# Patient Record
Sex: Female | Born: 1935 | Race: White | Hispanic: No
Health system: Southern US, Community
[De-identification: ages and names within clinical notes are randomized; demographics above are authoritative.]

---

## 2004-05-11 ENCOUNTER — Ambulatory Visit: Payer: Self-pay | Admitting: Oncology

## 2004-05-18 ENCOUNTER — Encounter: Payer: Self-pay | Admitting: Oncology

## 2004-06-06 ENCOUNTER — Encounter: Payer: Self-pay | Admitting: Oncology

## 2004-06-12 ENCOUNTER — Ambulatory Visit: Payer: Self-pay | Admitting: Oncology

## 2004-07-10 ENCOUNTER — Ambulatory Visit: Payer: Self-pay | Admitting: Oncology

## 2004-07-17 ENCOUNTER — Encounter: Payer: Self-pay | Admitting: Oncology

## 2004-08-06 ENCOUNTER — Encounter: Payer: Self-pay | Admitting: Oncology

## 2004-08-06 ENCOUNTER — Ambulatory Visit: Payer: Self-pay | Admitting: Oncology

## 2004-09-06 ENCOUNTER — Ambulatory Visit: Payer: Self-pay | Admitting: Oncology

## 2004-11-13 ENCOUNTER — Ambulatory Visit: Payer: Self-pay | Admitting: Oncology

## 2004-11-20 ENCOUNTER — Ambulatory Visit: Payer: Self-pay | Admitting: Oncology

## 2004-11-21 ENCOUNTER — Emergency Department: Payer: Self-pay | Admitting: Emergency Medicine

## 2004-12-04 ENCOUNTER — Ambulatory Visit: Payer: Self-pay | Admitting: Oncology

## 2004-12-20 ENCOUNTER — Emergency Department: Payer: Self-pay | Admitting: Unknown Physician Specialty

## 2005-01-04 ENCOUNTER — Ambulatory Visit: Payer: Self-pay | Admitting: Oncology

## 2005-02-19 ENCOUNTER — Ambulatory Visit: Payer: Self-pay | Admitting: Family Medicine

## 2005-02-20 ENCOUNTER — Ambulatory Visit: Payer: Self-pay | Admitting: Family Medicine

## 2005-02-26 ENCOUNTER — Ambulatory Visit: Payer: Self-pay | Admitting: Oncology

## 2005-03-09 ENCOUNTER — Ambulatory Visit: Payer: Self-pay | Admitting: Oncology

## 2005-03-15 ENCOUNTER — Ambulatory Visit: Payer: Self-pay | Admitting: Oncology

## 2005-04-06 ENCOUNTER — Ambulatory Visit: Payer: Self-pay | Admitting: Oncology

## 2005-05-06 ENCOUNTER — Ambulatory Visit: Payer: Self-pay | Admitting: Oncology

## 2005-06-06 ENCOUNTER — Ambulatory Visit: Payer: Self-pay | Admitting: Oncology

## 2005-07-06 ENCOUNTER — Ambulatory Visit: Payer: Self-pay | Admitting: Oncology

## 2005-09-05 ENCOUNTER — Ambulatory Visit: Payer: Self-pay | Admitting: Oncology

## 2005-11-03 ENCOUNTER — Other Ambulatory Visit: Payer: Self-pay

## 2005-11-03 ENCOUNTER — Emergency Department: Payer: Self-pay | Admitting: Unknown Physician Specialty

## 2005-12-04 ENCOUNTER — Ambulatory Visit: Payer: Self-pay | Admitting: Oncology

## 2006-01-04 ENCOUNTER — Ambulatory Visit: Payer: Self-pay | Admitting: Oncology

## 2006-02-03 ENCOUNTER — Ambulatory Visit: Payer: Self-pay | Admitting: Oncology

## 2006-03-06 ENCOUNTER — Ambulatory Visit: Payer: Self-pay | Admitting: Oncology

## 2006-06-03 ENCOUNTER — Ambulatory Visit: Payer: Self-pay | Admitting: Oncology

## 2006-06-06 ENCOUNTER — Ambulatory Visit: Payer: Self-pay | Admitting: Oncology

## 2006-09-02 ENCOUNTER — Ambulatory Visit: Payer: Self-pay | Admitting: Oncology

## 2006-09-10 ENCOUNTER — Ambulatory Visit: Payer: Self-pay | Admitting: Oncology

## 2006-10-04 ENCOUNTER — Emergency Department: Payer: Self-pay | Admitting: Emergency Medicine

## 2006-10-04 ENCOUNTER — Other Ambulatory Visit: Payer: Self-pay

## 2006-10-06 ENCOUNTER — Ambulatory Visit: Payer: Self-pay | Admitting: Oncology

## 2007-02-04 ENCOUNTER — Ambulatory Visit: Payer: Self-pay | Admitting: Internal Medicine

## 2007-02-28 ENCOUNTER — Ambulatory Visit: Payer: Self-pay | Admitting: Internal Medicine

## 2007-03-07 ENCOUNTER — Ambulatory Visit: Payer: Self-pay | Admitting: Internal Medicine

## 2007-07-12 ENCOUNTER — Other Ambulatory Visit: Payer: Self-pay

## 2007-07-12 ENCOUNTER — Inpatient Hospital Stay: Payer: Self-pay | Admitting: Internal Medicine

## 2007-09-07 ENCOUNTER — Ambulatory Visit: Payer: Self-pay | Admitting: Internal Medicine

## 2007-09-22 ENCOUNTER — Ambulatory Visit: Payer: Self-pay | Admitting: Internal Medicine

## 2007-10-05 ENCOUNTER — Ambulatory Visit: Payer: Self-pay | Admitting: Internal Medicine

## 2007-12-25 ENCOUNTER — Emergency Department: Payer: Self-pay | Admitting: Emergency Medicine

## 2007-12-25 ENCOUNTER — Other Ambulatory Visit: Payer: Self-pay

## 2008-04-04 ENCOUNTER — Emergency Department: Payer: Self-pay | Admitting: Emergency Medicine

## 2008-09-01 ENCOUNTER — Emergency Department: Payer: Self-pay | Admitting: Emergency Medicine

## 2009-01-01 ENCOUNTER — Emergency Department: Payer: Self-pay | Admitting: Unknown Physician Specialty

## 2009-02-16 ENCOUNTER — Ambulatory Visit: Payer: Self-pay | Admitting: Orthopedic Surgery

## 2009-03-02 ENCOUNTER — Ambulatory Visit: Payer: Self-pay | Admitting: Orthopedic Surgery

## 2009-03-15 ENCOUNTER — Ambulatory Visit: Payer: Self-pay | Admitting: Pain Medicine

## 2009-05-24 ENCOUNTER — Ambulatory Visit: Payer: Self-pay | Admitting: Pain Medicine

## 2009-06-15 ENCOUNTER — Ambulatory Visit: Payer: Self-pay | Admitting: Physician Assistant

## 2009-07-27 ENCOUNTER — Emergency Department: Payer: Self-pay

## 2010-08-04 ENCOUNTER — Ambulatory Visit: Payer: Self-pay | Admitting: Ophthalmology

## 2010-08-14 ENCOUNTER — Ambulatory Visit: Payer: Self-pay | Admitting: Ophthalmology

## 2011-04-26 ENCOUNTER — Inpatient Hospital Stay: Payer: Self-pay | Admitting: Internal Medicine

## 2011-05-01 ENCOUNTER — Emergency Department: Payer: Self-pay | Admitting: Emergency Medicine

## 2011-05-05 ENCOUNTER — Inpatient Hospital Stay: Payer: Self-pay | Admitting: Specialist

## 2011-05-28 ENCOUNTER — Emergency Department: Payer: Self-pay | Admitting: Emergency Medicine

## 2011-05-29 ENCOUNTER — Emergency Department: Payer: Self-pay | Admitting: *Deleted

## 2011-06-07 ENCOUNTER — Ambulatory Visit: Payer: Self-pay | Admitting: Internal Medicine

## 2011-06-17 ENCOUNTER — Emergency Department: Payer: Self-pay | Admitting: Internal Medicine

## 2011-06-19 ENCOUNTER — Encounter: Payer: Self-pay | Admitting: Cardiothoracic Surgery

## 2011-06-19 ENCOUNTER — Encounter: Payer: Self-pay | Admitting: Nurse Practitioner

## 2011-06-27 ENCOUNTER — Inpatient Hospital Stay: Payer: Self-pay | Admitting: Internal Medicine

## 2011-07-07 ENCOUNTER — Ambulatory Visit: Payer: Self-pay | Admitting: Internal Medicine

## 2011-09-25 ENCOUNTER — Inpatient Hospital Stay: Payer: Self-pay | Admitting: Internal Medicine

## 2011-09-25 LAB — PRO B NATRIURETIC PEPTIDE: B-Type Natriuretic Peptide: 29710 pg/mL — ABNORMAL HIGH (ref 0–450)

## 2011-09-25 LAB — COMPREHENSIVE METABOLIC PANEL
Calcium, Total: 8.9 mg/dL (ref 8.5–10.1)
Chloride: 96 mmol/L — ABNORMAL LOW (ref 98–107)
Co2: 27 mmol/L (ref 21–32)
EGFR (African American): >60
EGFR (Non-African Amer.): >60
Glucose: 111 mg/dL — ABNORMAL HIGH (ref 65–99)
Potassium: 4.1 mmol/L (ref 3.5–5.1)
SGOT(AST): 41 U/L — ABNORMAL HIGH (ref 15–37)
Sodium: 132 mmol/L — ABNORMAL LOW (ref 136–145)

## 2011-09-25 LAB — URINALYSIS, COMPLETE
Glucose,UR: NEGATIVE mg/dL (ref 0–75)
Hyaline Cast: 1
Ketone: NEGATIVE
Nitrite: NEGATIVE
Specific Gravity: 1.008 (ref 1.003–1.030)
Squamous Epithelial: <1
WBC UR: 4 /HPF (ref 0–5)

## 2011-09-25 LAB — CBC
HCT: 40.8 % (ref 35.0–47.0)
HGB: 13.5 g/dL (ref 12.0–16.0)
MCHC: 33.2 g/dL (ref 32.0–36.0)
RBC: 4.36 10*6/uL (ref 3.80–5.20)
WBC: 11.6 10*3/uL — ABNORMAL HIGH (ref 3.6–11.0)

## 2011-09-25 LAB — TROPONIN I: Troponin-I: 0.03 ng/mL

## 2011-09-25 LAB — CK TOTAL AND CKMB (NOT AT ARMC): CK, Total: 78 U/L (ref 21–215)

## 2011-09-26 LAB — CBC WITH DIFFERENTIAL/PLATELET
Basophil #: 0 10*3/uL (ref 0.0–0.1)
Basophil %: 0.2 %
Eosinophil %: 0.1 %
HCT: 32.9 % — ABNORMAL LOW (ref 35.0–47.0)
HGB: 10.8 g/dL — ABNORMAL LOW (ref 12.0–16.0)
Lymphocyte #: 1.6 10*3/uL (ref 1.0–3.6)
Lymphocyte %: 10.2 %
Monocyte %: 10.4 %
Neutrophil #: 12.5 10*3/uL — ABNORMAL HIGH (ref 1.4–6.5)
Platelet: 188 10*3/uL (ref 150–440)
RBC: 3.54 10*6/uL — ABNORMAL LOW (ref 3.80–5.20)
RDW: 13.9 % (ref 11.5–14.5)
WBC: 15.8 10*3/uL — ABNORMAL HIGH (ref 3.6–11.0)

## 2011-09-26 LAB — BASIC METABOLIC PANEL
Anion Gap: 10 (ref 7–16)
BUN: 22 mg/dL — ABNORMAL HIGH (ref 7–18)
Chloride: 96 mmol/L — ABNORMAL LOW (ref 98–107)
Co2: 29 mmol/L (ref 21–32)
Creatinine: 1.14 mg/dL (ref 0.60–1.30)
EGFR (African American): 58 — ABNORMAL LOW
EGFR (Non-African Amer.): 48 — ABNORMAL LOW
Sodium: 135 mmol/L — ABNORMAL LOW (ref 136–145)

## 2011-09-26 LAB — LIPID PANEL
Cholesterol: 135 mg/dL (ref 0–200)
Ldl Cholesterol, Calc: 82 mg/dL (ref 0–100)
Triglycerides: 69 mg/dL (ref 0–200)

## 2011-09-27 LAB — BASIC METABOLIC PANEL
BUN: 29 mg/dL — ABNORMAL HIGH (ref 7–18)
Calcium, Total: 9 mg/dL (ref 8.5–10.1)
Co2: 30 mmol/L (ref 21–32)
EGFR (Non-African Amer.): 51 — ABNORMAL LOW
Glucose: 82 mg/dL (ref 65–99)
Potassium: 3.9 mmol/L (ref 3.5–5.1)
Sodium: 136 mmol/L (ref 136–145)

## 2011-09-27 LAB — CBC WITH DIFFERENTIAL/PLATELET
Basophil %: 0.5 %
Eosinophil #: 0.2 10*3/uL (ref 0.0–0.7)
Eosinophil %: 2.1 %
Lymphocyte #: 2.4 10*3/uL (ref 1.0–3.6)
Lymphocyte %: 29.6 %
MCH: 30.4 pg (ref 26.0–34.0)
MCV: 93 fL (ref 80–100)
Monocyte #: 0.8 10*3/uL — ABNORMAL HIGH (ref 0.0–0.7)
Monocyte %: 10.1 %
Neutrophil #: 4.6 10*3/uL (ref 1.4–6.5)
Neutrophil %: 57.7 %
Platelet: 220 10*3/uL (ref 150–440)
RBC: 3.59 10*6/uL — ABNORMAL LOW (ref 3.80–5.20)

## 2011-09-28 LAB — BASIC METABOLIC PANEL
Anion Gap: 13 (ref 7–16)
BUN: 31 mg/dL — ABNORMAL HIGH (ref 7–18)
Calcium, Total: 8.4 mg/dL — ABNORMAL LOW (ref 8.5–10.1)
Chloride: 99 mmol/L (ref 98–107)
Co2: 26 mmol/L (ref 21–32)
Creatinine: 0.71 mg/dL (ref 0.60–1.30)
EGFR (African American): >60
Osmolality: 281 (ref 275–301)
Sodium: 138 mmol/L (ref 136–145)

## 2011-09-28 LAB — CBC WITH DIFFERENTIAL/PLATELET
Basophil %: 0.3 %
Eosinophil %: 3.3 %
Lymphocyte #: 1.3 10*3/uL (ref 1.0–3.6)
MCV: 94 fL (ref 80–100)
Monocyte %: 14.3 %
Platelet: 193 10*3/uL (ref 150–440)
RBC: 3.42 10*6/uL — ABNORMAL LOW (ref 3.80–5.20)
WBC: 5.6 10*3/uL (ref 3.6–11.0)

## 2011-09-30 ENCOUNTER — Inpatient Hospital Stay: Payer: Self-pay | Admitting: Internal Medicine

## 2011-09-30 LAB — COMPREHENSIVE METABOLIC PANEL
Albumin: 2.9 g/dL — ABNORMAL LOW (ref 3.4–5.0)
Alkaline Phosphatase: 94 U/L (ref 50–136)
BUN: 44 mg/dL — ABNORMAL HIGH (ref 7–18)
Chloride: 101 mmol/L (ref 98–107)
Co2: 28 mmol/L (ref 21–32)
Creatinine: 1.78 mg/dL — ABNORMAL HIGH (ref 0.60–1.30)
EGFR (African American): 35 — ABNORMAL LOW
EGFR (Non-African Amer.): 29 — ABNORMAL LOW
Glucose: 119 mg/dL — ABNORMAL HIGH (ref 65–99)
Osmolality: 290 (ref 275–301)
Potassium: 4.8 mmol/L (ref 3.5–5.1)
Sodium: 139 mmol/L (ref 136–145)

## 2011-09-30 LAB — CBC
HCT: 33.2 % — ABNORMAL LOW (ref 35.0–47.0)
HGB: 11 g/dL — ABNORMAL LOW (ref 12.0–16.0)
MCH: 30.7 pg (ref 26.0–34.0)
Platelet: 209 10*3/uL (ref 150–440)
RBC: 3.59 10*6/uL — ABNORMAL LOW (ref 3.80–5.20)
WBC: 9.4 10*3/uL (ref 3.6–11.0)

## 2011-09-30 LAB — PHOSPHORUS: Phosphorus: 4.7 mg/dL (ref 2.5–4.9)

## 2011-09-30 LAB — CULTURE, BLOOD (SINGLE)

## 2011-09-30 LAB — PRO B NATRIURETIC PEPTIDE: B-Type Natriuretic Peptide: 18474 pg/mL — ABNORMAL HIGH (ref 0–450)

## 2011-09-30 LAB — CK TOTAL AND CKMB (NOT AT ARMC)
CK, Total: 22 U/L (ref 21–215)
CK-MB: 0.9 ng/mL (ref 0.5–3.6)

## 2011-10-01 LAB — URINALYSIS, COMPLETE
Leukocyte Esterase: NEGATIVE
Nitrite: NEGATIVE
Ph: 5 (ref 4.5–8.0)
Protein: NEGATIVE
RBC,UR: 244 /HPF (ref 0–5)
WBC UR: 4 /HPF (ref 0–5)

## 2011-10-01 LAB — COMPREHENSIVE METABOLIC PANEL
Albumin: 2.7 g/dL — ABNORMAL LOW (ref 3.4–5.0)
Alkaline Phosphatase: 80 U/L (ref 50–136)
Anion Gap: 9 (ref 7–16)
Calcium, Total: 8.9 mg/dL (ref 8.5–10.1)
Chloride: 104 mmol/L (ref 98–107)
Co2: 30 mmol/L (ref 21–32)
EGFR (Non-African Amer.): 30 — ABNORMAL LOW
Glucose: 103 mg/dL — ABNORMAL HIGH (ref 65–99)
Potassium: 4.7 mmol/L (ref 3.5–5.1)
SGOT(AST): 61 U/L — ABNORMAL HIGH (ref 15–37)
SGPT (ALT): 59 U/L
Sodium: 143 mmol/L (ref 136–145)

## 2011-10-01 LAB — CBC WITH DIFFERENTIAL/PLATELET
Basophil #: 0 10*3/uL (ref 0.0–0.1)
Basophil %: 0.3 %
Eosinophil %: 0.3 %
HCT: 30.3 % — ABNORMAL LOW (ref 35.0–47.0)
HGB: 10 g/dL — ABNORMAL LOW (ref 12.0–16.0)
Lymphocyte %: 16.6 %
MCHC: 33.1 g/dL (ref 32.0–36.0)
MCV: 92 fL (ref 80–100)
Monocyte #: 0.8 10*3/uL — ABNORMAL HIGH (ref 0.0–0.7)
Neutrophil %: 73.2 %
Platelet: 194 10*3/uL (ref 150–440)
RBC: 3.3 10*6/uL — ABNORMAL LOW (ref 3.80–5.20)
WBC: 8.4 10*3/uL (ref 3.6–11.0)

## 2011-10-01 LAB — CK TOTAL AND CKMB (NOT AT ARMC)
CK, Total: 26 U/L (ref 21–215)
CK-MB: 1.4 ng/mL (ref 0.5–3.6)
CK-MB: 1 ng/mL (ref 0.5–3.6)

## 2011-10-01 LAB — TROPONIN I
Troponin-I: 0.07 ng/mL — ABNORMAL HIGH
Troponin-I: 0.06 ng/mL — ABNORMAL HIGH

## 2011-10-02 LAB — CBC WITH DIFFERENTIAL/PLATELET
Basophil #: 0 10*3/uL (ref 0.0–0.1)
Basophil %: 0.2 %
Eosinophil %: 2.3 %
Lymphocyte %: 20.4 %
MCHC: 33 g/dL (ref 32.0–36.0)
MCV: 92 fL (ref 80–100)
Monocyte #: 0.8 10*3/uL — ABNORMAL HIGH (ref 0.0–0.7)
Monocyte %: 12.7 %
Platelet: 198 10*3/uL (ref 150–440)
RDW: 13.9 % (ref 11.5–14.5)
WBC: 6.6 10*3/uL (ref 3.6–11.0)

## 2011-10-02 LAB — APTT: Activated PTT: 32.3 secs (ref 23.6–35.9)

## 2011-10-02 LAB — BASIC METABOLIC PANEL
Anion Gap: 11 (ref 7–16)
Calcium, Total: 8.6 mg/dL (ref 8.5–10.1)
EGFR (African American): 47 — ABNORMAL LOW
EGFR (Non-African Amer.): 39 — ABNORMAL LOW
Osmolality: 293 (ref 275–301)
Sodium: 144 mmol/L (ref 136–145)

## 2011-10-02 LAB — PROTIME-INR: INR: 1.1

## 2011-10-30 ENCOUNTER — Ambulatory Visit: Payer: Self-pay | Admitting: Internal Medicine

## 2011-10-30 LAB — CBC WITH DIFFERENTIAL/PLATELET
Basophil #: 0.1 10*3/uL (ref 0.0–0.1)
Basophil %: 1 %
Eosinophil #: 0.2 10*3/uL (ref 0.0–0.7)
Eosinophil %: 2.8 %
HCT: 32.9 % — ABNORMAL LOW (ref 35.0–47.0)
HGB: 11 g/dL — ABNORMAL LOW (ref 12.0–16.0)
Lymphocyte #: 1.9 10*3/uL (ref 1.0–3.6)
Lymphocyte %: 30.2 %
MCH: 30.4 pg (ref 26.0–34.0)
MCHC: 33.4 g/dL (ref 32.0–36.0)
MCV: 91 fL (ref 80–100)
Monocyte #: 1 10*3/uL — ABNORMAL HIGH (ref 0.0–0.7)
Monocyte %: 15.7 %
Neutrophil #: 3.2 10*3/uL (ref 1.4–6.5)
Neutrophil %: 50.3 %
Platelet: 278 10*3/uL (ref 150–440)
RBC: 3.62 10*6/uL — ABNORMAL LOW (ref 3.80–5.20)
RDW: 13.9 % (ref 11.5–14.5)
WBC: 6.5 10*3/uL (ref 3.6–11.0)

## 2011-10-30 LAB — URINALYSIS, COMPLETE
Bacteria: NONE SEEN
Bilirubin,UR: NEGATIVE
Blood: NEGATIVE
Glucose,UR: NEGATIVE mg/dL (ref 0–75)
Hyaline Cast: 4
Ketone: NEGATIVE
Leukocyte Esterase: NEGATIVE
Nitrite: NEGATIVE
Ph: 5 (ref 4.5–8.0)
Protein: NEGATIVE
RBC,UR: NONE SEEN /HPF (ref 0–5)
Specific Gravity: 1.017 (ref 1.003–1.030)
Squamous Epithelial: 8
WBC UR: 5 /HPF (ref 0–5)

## 2011-10-30 LAB — BASIC METABOLIC PANEL
Anion Gap: 7 (ref 7–16)
BUN: 22 mg/dL — ABNORMAL HIGH (ref 7–18)
Calcium, Total: 9 mg/dL (ref 8.5–10.1)
Chloride: 101 mmol/L (ref 98–107)
Co2: 33 mmol/L — ABNORMAL HIGH (ref 21–32)
Creatinine: 1.12 mg/dL (ref 0.60–1.30)
EGFR (African American): 59 — ABNORMAL LOW
EGFR (Non-African Amer.): 49 — ABNORMAL LOW
Glucose: 94 mg/dL (ref 65–99)
Osmolality: 284 (ref 275–301)
Potassium: 3.4 mmol/L — ABNORMAL LOW (ref 3.5–5.1)
Sodium: 141 mmol/L (ref 136–145)

## 2011-10-30 LAB — TSH: Thyroid Stimulating Horm: 1.76 u[IU]/mL

## 2011-11-21 ENCOUNTER — Inpatient Hospital Stay: Payer: Self-pay | Admitting: Specialist

## 2011-11-21 LAB — CBC
HCT: 32.7 % — ABNORMAL LOW (ref 35.0–47.0)
MCH: 29.5 pg (ref 26.0–34.0)
Platelet: 177 10*3/uL (ref 150–440)
RBC: 3.56 10*6/uL — ABNORMAL LOW (ref 3.80–5.20)
RDW: 14.7 % — ABNORMAL HIGH (ref 11.5–14.5)

## 2011-11-21 LAB — COMPREHENSIVE METABOLIC PANEL
Albumin: 3.5 g/dL (ref 3.4–5.0)
Alkaline Phosphatase: 74 U/L (ref 50–136)
Anion Gap: 6 — ABNORMAL LOW (ref 7–16)
BUN: 17 mg/dL (ref 7–18)
Bilirubin,Total: 0.6 mg/dL (ref 0.2–1.0)
Calcium, Total: 8.9 mg/dL (ref 8.5–10.1)
Chloride: 96 mmol/L — ABNORMAL LOW (ref 98–107)
Co2: 36 mmol/L — ABNORMAL HIGH (ref 21–32)
EGFR (African American): >60
EGFR (Non-African Amer.): >60
Glucose: 79 mg/dL (ref 65–99)
Osmolality: 276 (ref 275–301)

## 2011-11-21 LAB — CK TOTAL AND CKMB (NOT AT ARMC)
CK, Total: 44 U/L (ref 21–215)
CK-MB: 1.8 ng/mL (ref 0.5–3.6)

## 2011-11-21 LAB — TROPONIN I: Troponin-I: 0.04 ng/mL

## 2011-11-22 LAB — CBC WITH DIFFERENTIAL/PLATELET
Basophil %: 0.2 %
Eosinophil #: 0.1 10*3/uL (ref 0.0–0.7)
Eosinophil %: 2.4 %
HGB: 10.6 g/dL — ABNORMAL LOW (ref 12.0–16.0)
Lymphocyte #: 1.2 10*3/uL (ref 1.0–3.6)
Lymphocyte %: 21.4 %
MCH: 29.8 pg (ref 26.0–34.0)
MCHC: 32.5 g/dL (ref 32.0–36.0)
MCV: 92 fL (ref 80–100)
Neutrophil %: 60.4 %
Platelet: 184 10*3/uL (ref 150–440)
RBC: 3.54 10*6/uL — ABNORMAL LOW (ref 3.80–5.20)
RDW: 14.4 % (ref 11.5–14.5)

## 2011-11-22 LAB — BASIC METABOLIC PANEL
Calcium, Total: 9 mg/dL (ref 8.5–10.1)
Co2: 35 mmol/L — ABNORMAL HIGH (ref 21–32)
Creatinine: 0.81 mg/dL (ref 0.60–1.30)
EGFR (African American): >60
EGFR (Non-African Amer.): >60
Glucose: 110 mg/dL — ABNORMAL HIGH (ref 65–99)
Osmolality: 274 (ref 275–301)
Potassium: 5.1 mmol/L (ref 3.5–5.1)

## 2011-11-23 LAB — BASIC METABOLIC PANEL
Anion Gap: 3 — ABNORMAL LOW (ref 7–16)
Calcium, Total: 9.2 mg/dL (ref 8.5–10.1)
EGFR (African American): >60
Glucose: 86 mg/dL (ref 65–99)
Potassium: 4.1 mmol/L (ref 3.5–5.1)

## 2012-01-04 ENCOUNTER — Emergency Department: Payer: Self-pay | Admitting: Emergency Medicine

## 2012-01-04 LAB — COMPREHENSIVE METABOLIC PANEL
Albumin: 3.2 g/dL — ABNORMAL LOW (ref 3.4–5.0)
Bilirubin,Total: 0.3 mg/dL (ref 0.2–1.0)
Calcium, Total: 8.9 mg/dL (ref 8.5–10.1)
Chloride: 101 mmol/L (ref 98–107)
Co2: 32 mmol/L (ref 21–32)
Creatinine: 0.87 mg/dL (ref 0.60–1.30)
EGFR (African American): >60
EGFR (Non-African Amer.): 59 — ABNORMAL LOW
Osmolality: 281 (ref 275–301)
SGOT(AST): 29 U/L (ref 15–37)
SGPT (ALT): 23 U/L
Sodium: 140 mmol/L (ref 136–145)

## 2012-01-04 LAB — CBC
HCT: 33.8 % — ABNORMAL LOW (ref 35.0–47.0)
HGB: 11 g/dL — ABNORMAL LOW (ref 12.0–16.0)
MCH: 30 pg (ref 26.0–34.0)
MCHC: 32.6 g/dL (ref 32.0–36.0)
MCV: 92 fL (ref 80–100)
RDW: 14.8 % — ABNORMAL HIGH (ref 11.5–14.5)
WBC: 5.1 10*3/uL (ref 3.6–11.0)

## 2012-01-04 LAB — PROTIME-INR
INR: 1.1
Prothrombin Time: 14.5 secs (ref 11.5–14.7)

## 2012-01-04 LAB — TROPONIN I: Troponin-I: 0.02 ng/mL

## 2012-01-04 LAB — CK TOTAL AND CKMB (NOT AT ARMC)
CK, Total: 40 U/L (ref 21–215)
CK-MB: 2.3 ng/mL (ref 0.5–3.6)

## 2012-02-16 ENCOUNTER — Inpatient Hospital Stay: Payer: Self-pay | Admitting: Specialist

## 2012-02-16 LAB — LIPASE, BLOOD: Lipase: 98 U/L (ref 73–393)

## 2012-02-16 LAB — URINALYSIS, COMPLETE
Bacteria: NONE SEEN
Bilirubin,UR: NEGATIVE
Glucose,UR: NEGATIVE mg/dL (ref 0–75)
Hyaline Cast: 4
Ketone: NEGATIVE
Ph: 5 (ref 4.5–8.0)
Protein: 30
Specific Gravity: 1.017 (ref 1.003–1.030)
Squamous Epithelial: 1

## 2012-02-16 LAB — COMPREHENSIVE METABOLIC PANEL
Alkaline Phosphatase: 92 U/L (ref 50–136)
Anion Gap: 1 — ABNORMAL LOW (ref 7–16)
BUN: 20 mg/dL — ABNORMAL HIGH (ref 7–18)
Chloride: 102 mmol/L (ref 98–107)
Creatinine: 0.95 mg/dL (ref 0.60–1.30)
EGFR (African American): >60
Osmolality: 281 (ref 275–301)
Potassium: 4.1 mmol/L (ref 3.5–5.1)
Sodium: 140 mmol/L (ref 136–145)

## 2012-02-16 LAB — CBC WITH DIFFERENTIAL/PLATELET
Eosinophil %: 2.4 %
HGB: 11.5 g/dL — ABNORMAL LOW (ref 12.0–16.0)
Lymphocyte #: 1.3 10*3/uL (ref 1.0–3.6)
Lymphocyte %: 31 %
MCHC: 32.1 g/dL (ref 32.0–36.0)
MCV: 91 fL (ref 80–100)
Monocyte #: 0.7 x10 3/mm (ref 0.2–0.9)
Monocyte %: 15.4 %
Neutrophil %: 51.1 %
Platelet: 177 10*3/uL (ref 150–440)

## 2012-02-16 LAB — TROPONIN I
Troponin-I: 0.03 ng/mL
Troponin-I: 0.03 ng/mL

## 2012-02-16 LAB — CK TOTAL AND CKMB (NOT AT ARMC): CK, Total: 25 U/L (ref 21–215)

## 2012-02-16 LAB — TSH: Thyroid Stimulating Horm: 2.42 u[IU]/mL

## 2012-02-16 LAB — CK-MB: CK-MB: 0.8 ng/mL (ref 0.5–3.6)

## 2012-02-16 LAB — PRO B NATRIURETIC PEPTIDE: B-Type Natriuretic Peptide: 17271 pg/mL — ABNORMAL HIGH (ref 0–450)

## 2012-02-17 LAB — CBC WITH DIFFERENTIAL/PLATELET
Basophil #: 0 10*3/uL (ref 0.0–0.1)
Eosinophil #: 0.1 10*3/uL (ref 0.0–0.7)
Eosinophil %: 2.5 %
Lymphocyte #: 1.4 10*3/uL (ref 1.0–3.6)
Lymphocyte %: 32 %
MCV: 92 fL (ref 80–100)
Monocyte %: 17.4 %
Neutrophil %: 47.9 %
Platelet: 116 10*3/uL — ABNORMAL LOW (ref 150–440)
RBC: 3.81 10*6/uL (ref 3.80–5.20)
RDW: 14.5 % (ref 11.5–14.5)
WBC: 4.4 10*3/uL (ref 3.6–11.0)

## 2012-02-17 LAB — CK-MB: CK-MB: 1.1 ng/mL (ref 0.5–3.6)

## 2012-02-17 LAB — TROPONIN I: Troponin-I: 0.02 ng/mL

## 2012-02-18 LAB — URINE CULTURE

## 2012-02-27 ENCOUNTER — Ambulatory Visit: Payer: Self-pay | Admitting: Internal Medicine

## 2012-04-06 DEATH — deceased

## 2013-07-08 IMAGING — US US EXTREM LOW VENOUS BILAT
1 series · 17 of 24 positions shown · non-contrast
Comparison: none

REASON FOR EXAM: elevated d dimer
COMMENTS:

[Series 1: us extrem low venous bilat · 17 of 48 slices shown]
[im 1/48]
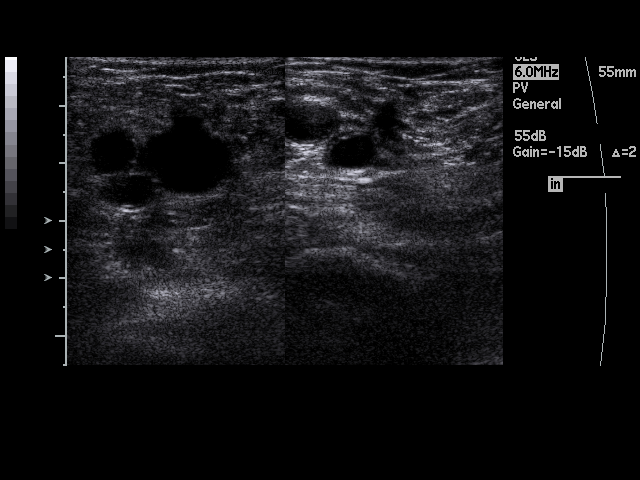
[im 5/48]
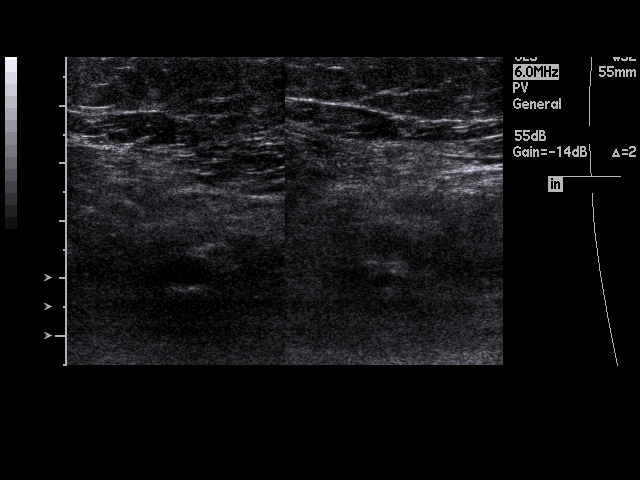
[im 7/48]
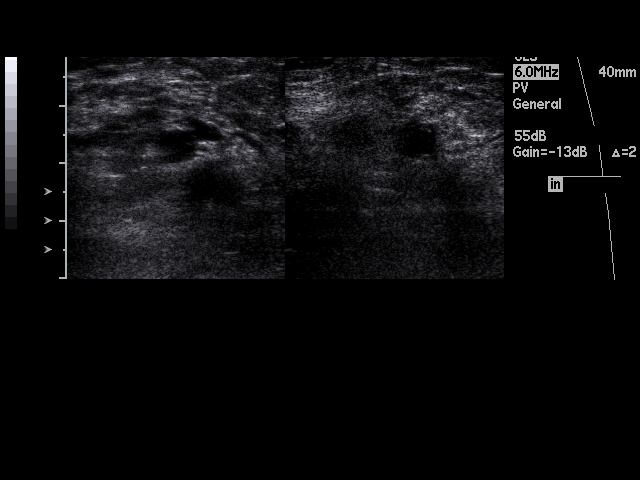
[im 9/48]
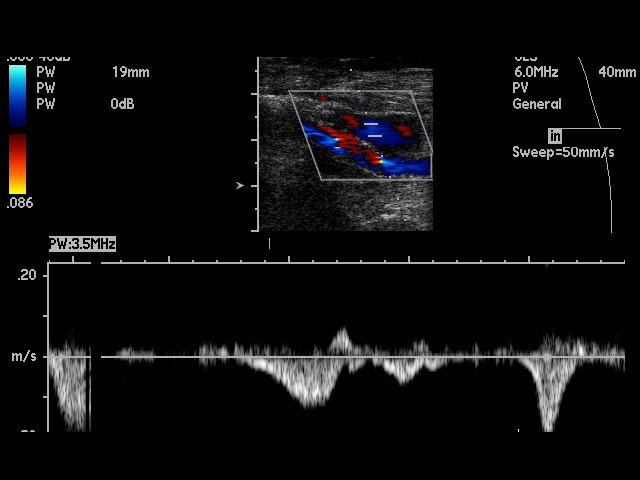
[im 13/48]
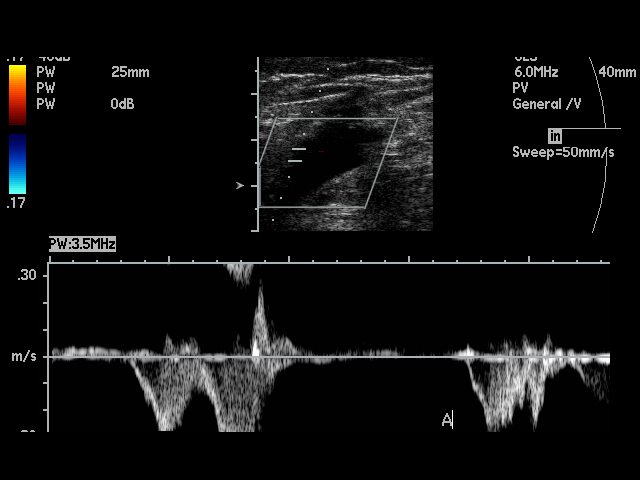
[im 15/48]
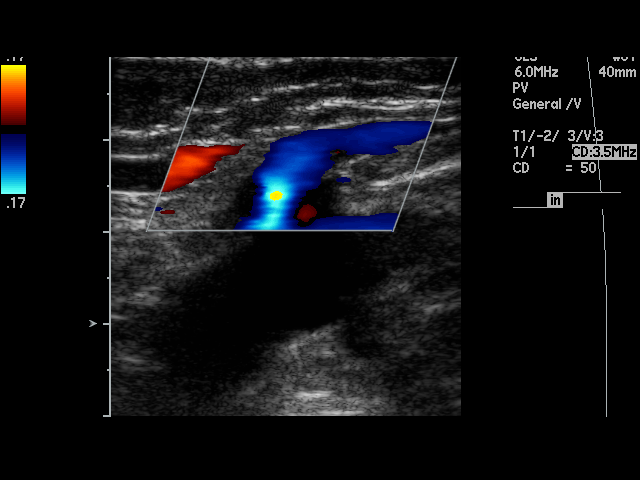
[im 19/48]
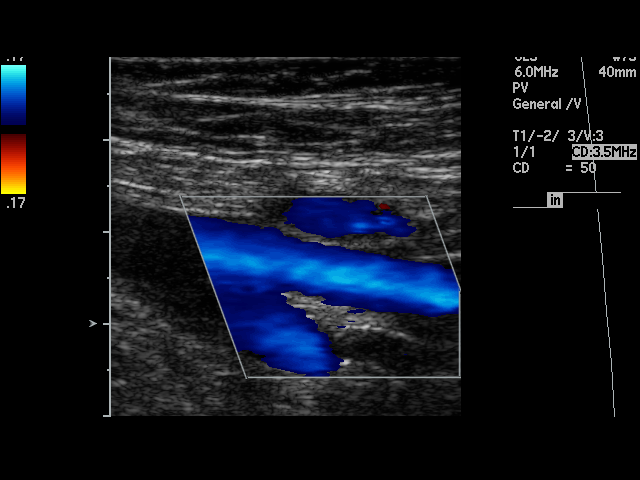
[im 21/48]
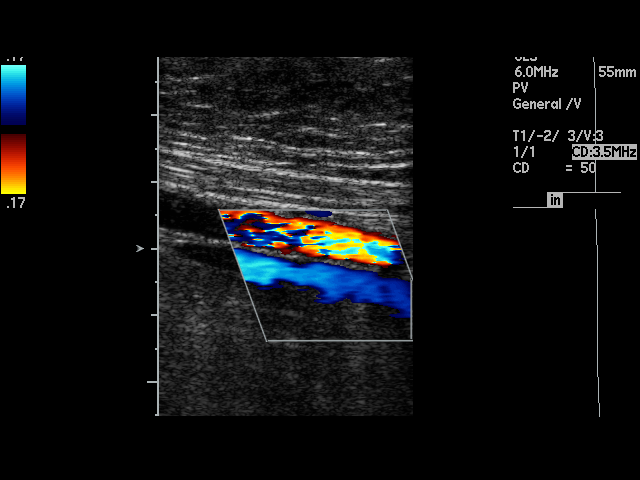
[im 25/48]
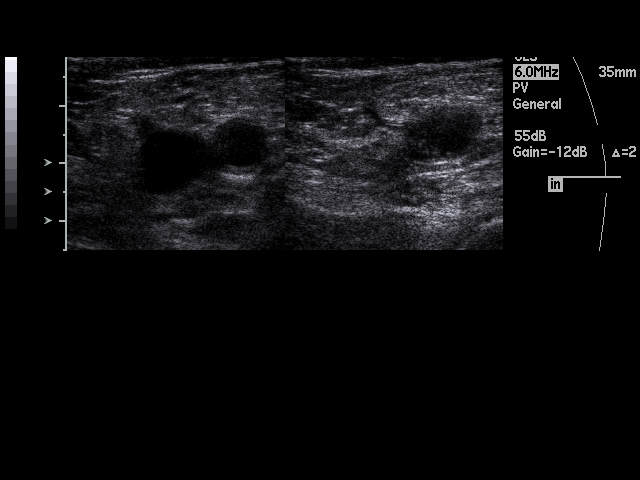
[im 27/48]
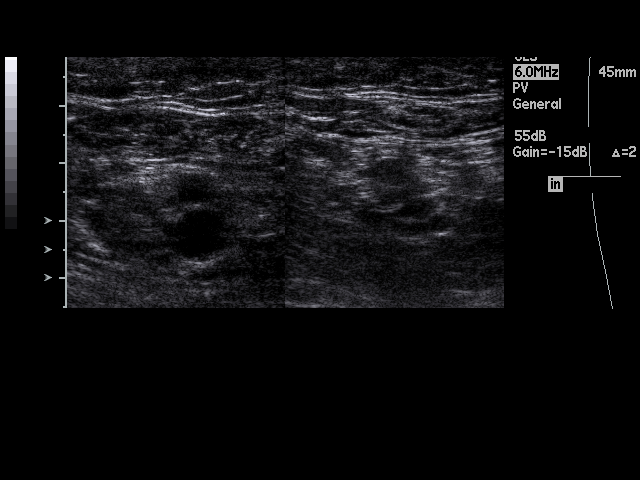
[im 29/48]
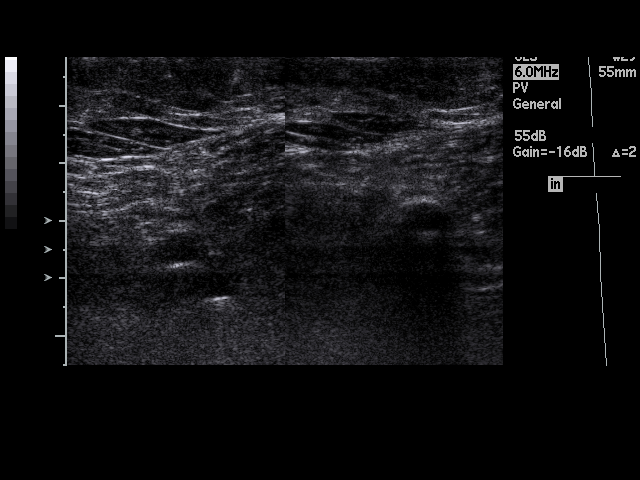
[im 33/48]
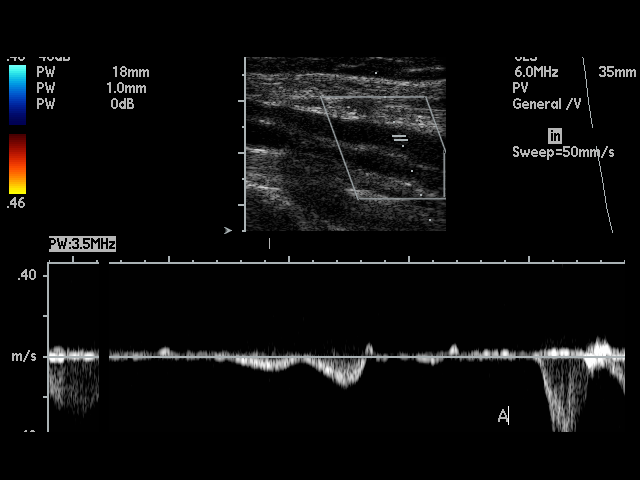
[im 35/48]
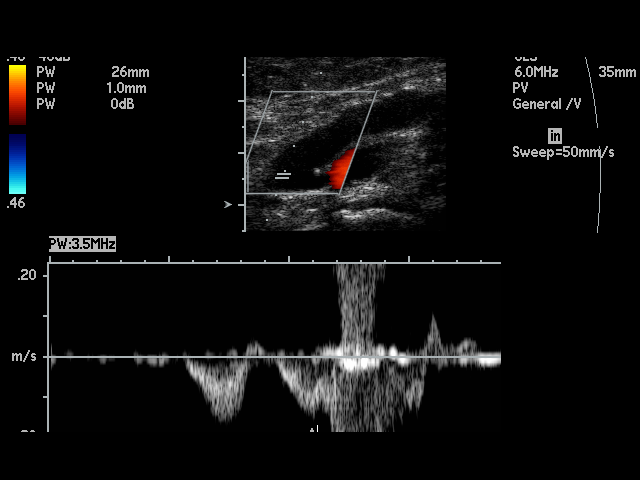
[im 39/48]
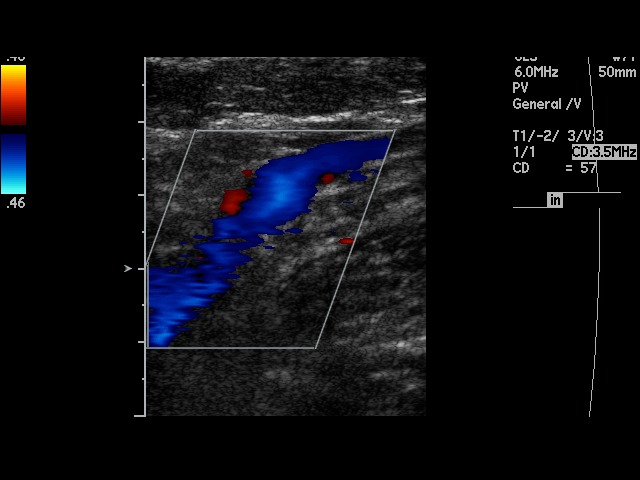
[im 41/48]
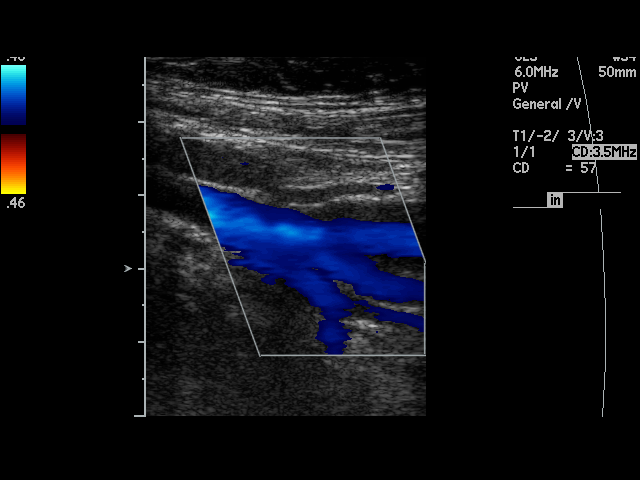
[im 43/48]
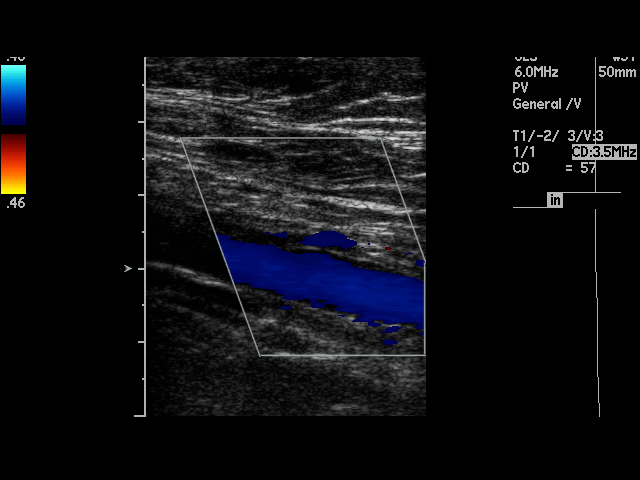
[im 48/48]
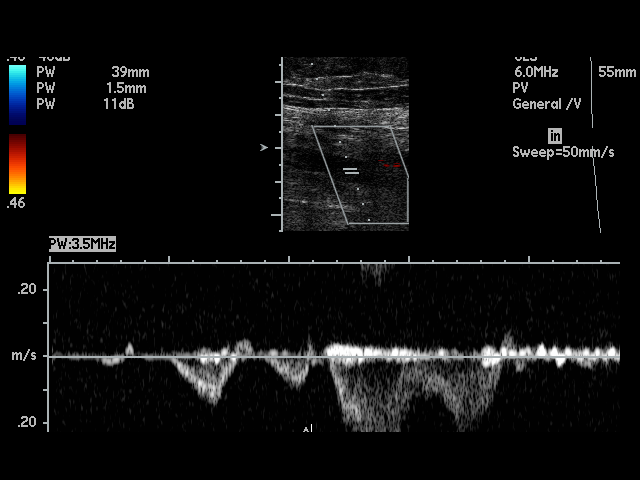

[17 of 24 positions shown; findings below may reference images not displayed]

PROCEDURE:     US  - US DOPPLER LOW EXTR BILATERAL  - April 26, 2011  [DATE]

RESULT:     The right and left femoral and popliteal veins are normally
compressible. The waveform patterns are normal and the color flow images are
normal. The response to the augmentation and Valsalva maneuvers is normal.
IMPRESSION: I see no evidence of thrombus within the right or left
femoral or popliteal veins.

## 2013-07-09 IMAGING — US US EXTREM UP VENOUS*L*
1 series · 17 of 24 positions shown · non-contrast
Comparison: none

REASON FOR EXAM: Left UE swelling
COMMENTS:

[Series 1: us extrem up venous*left* · 17 of 36 slices shown]
[im 1/36]
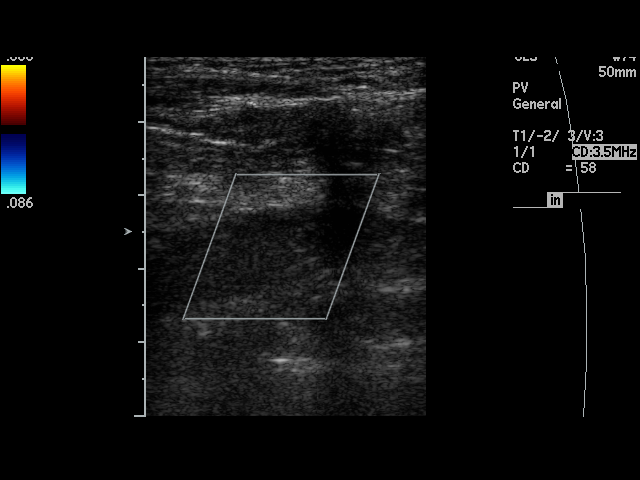
[im 4/36]
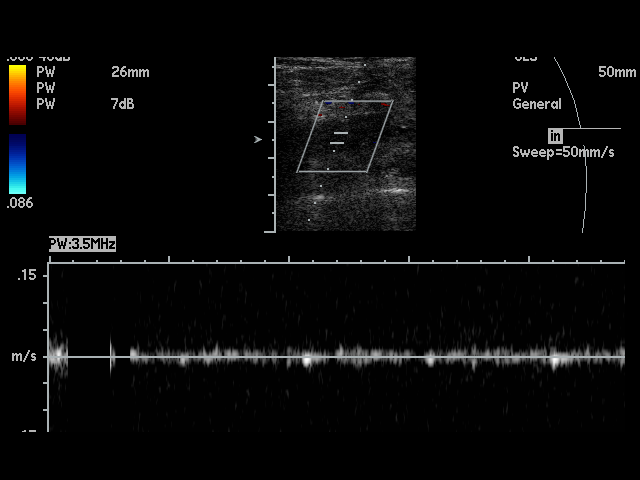
[im 5/36]
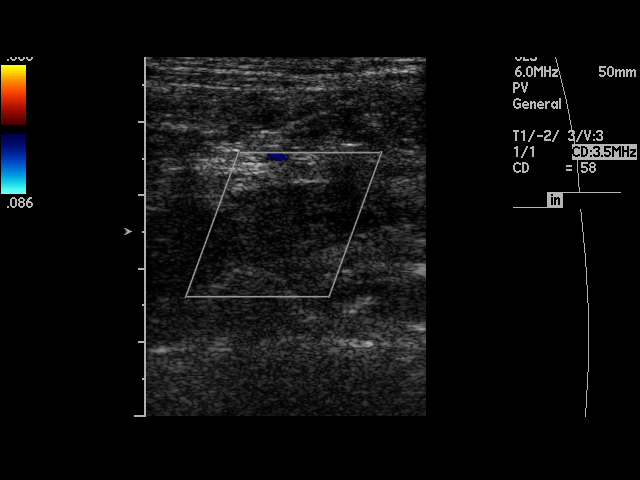
[im 7/36]
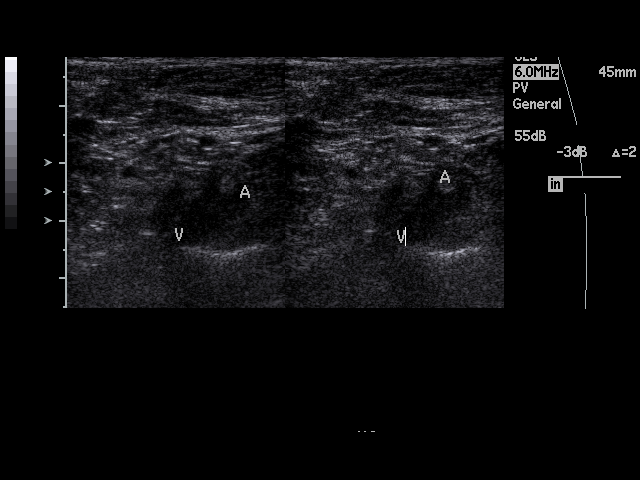
[im 10/36]
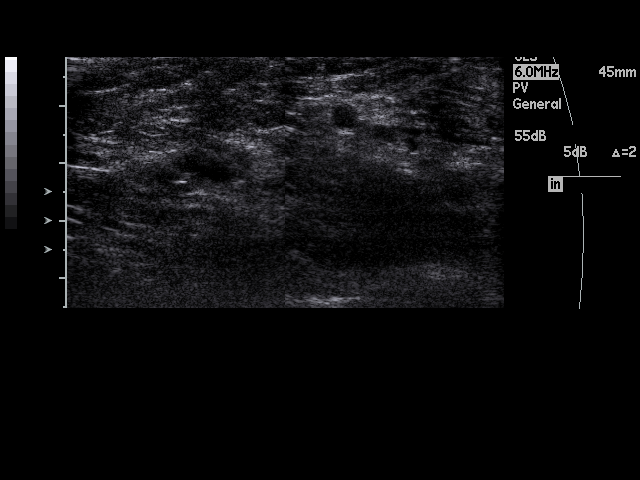
[im 11/36]
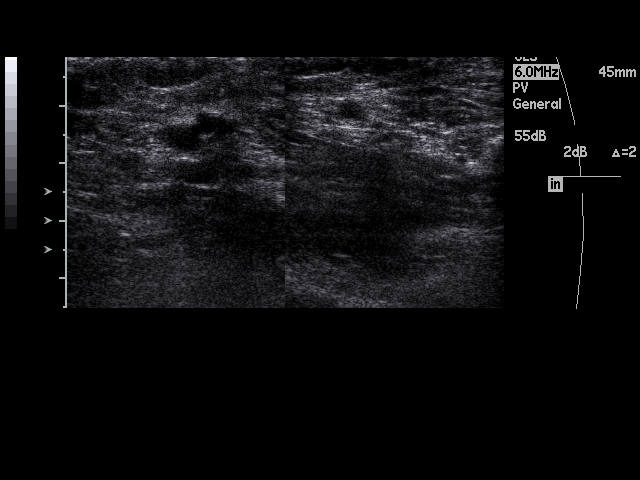
[im 14/36]
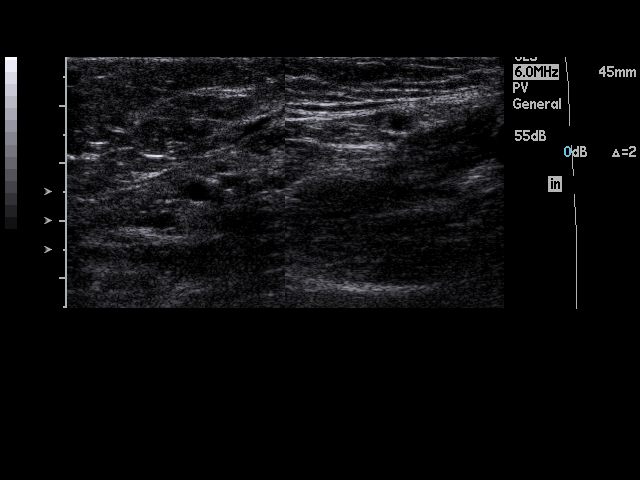
[im 16/36]
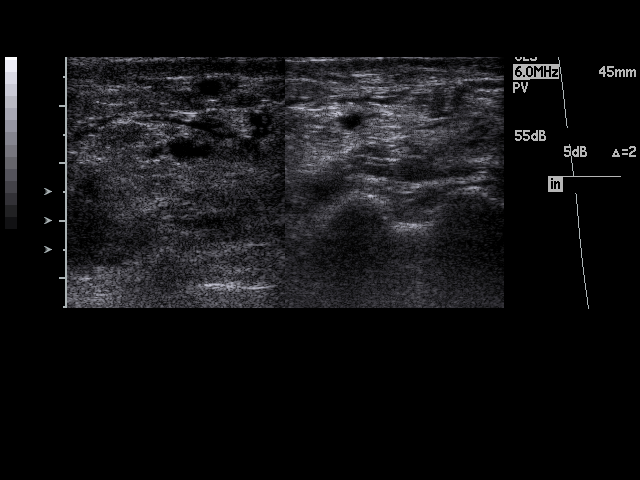
[im 19/36]
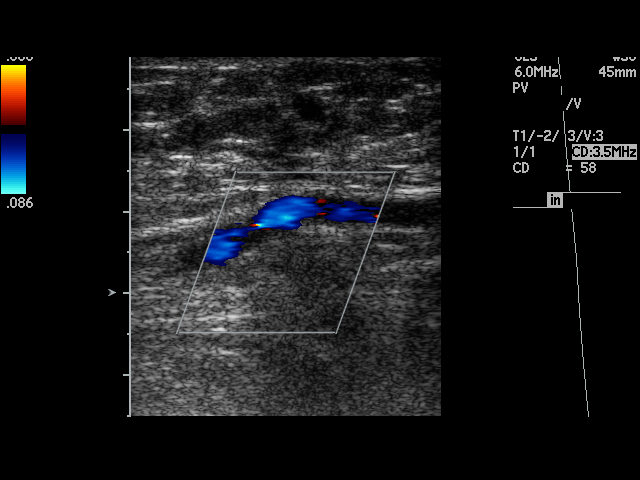
[im 20/36]
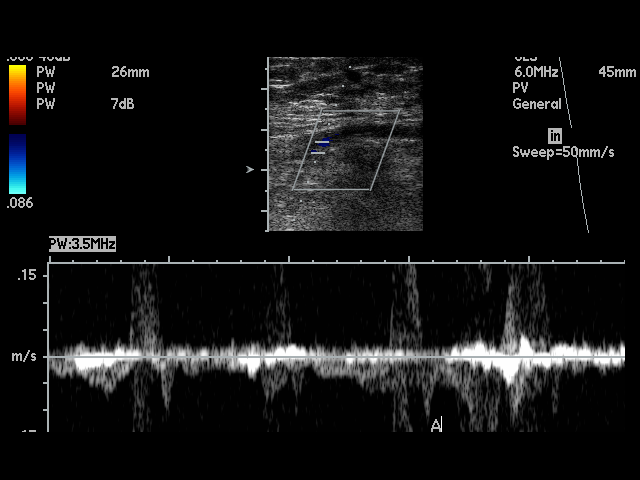
[im 22/36]
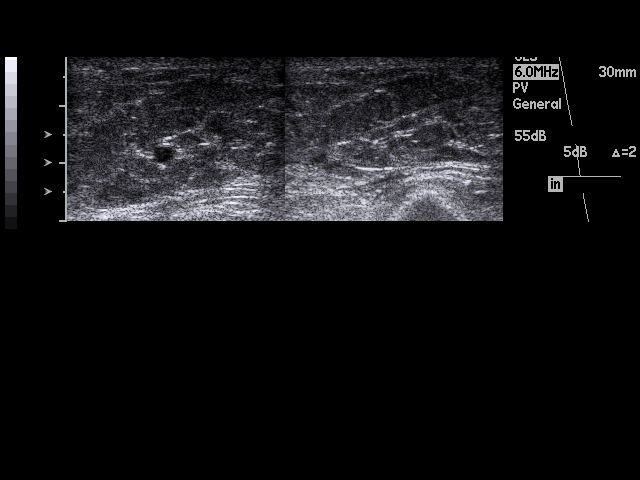
[im 25/36]
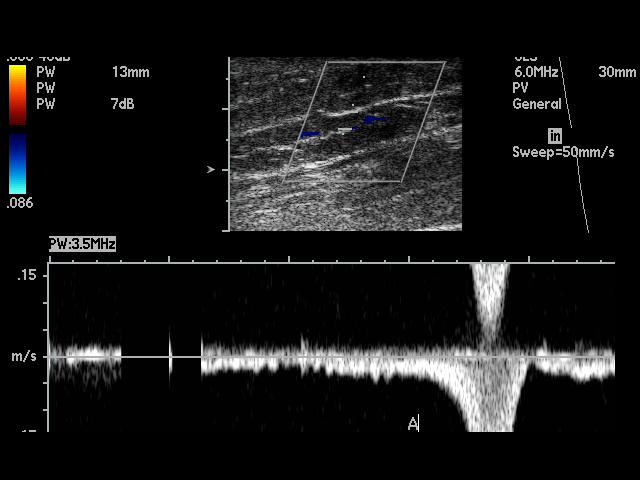
[im 26/36]
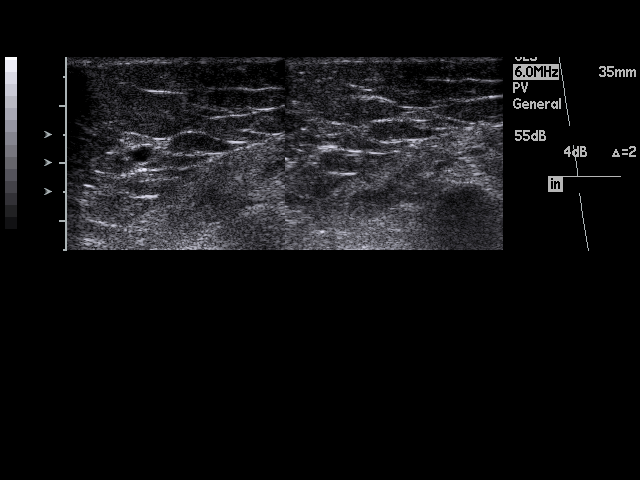
[im 29/36]
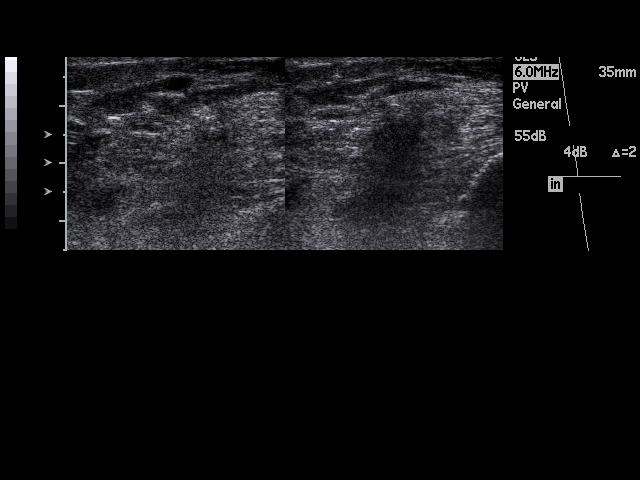
[im 31/36]
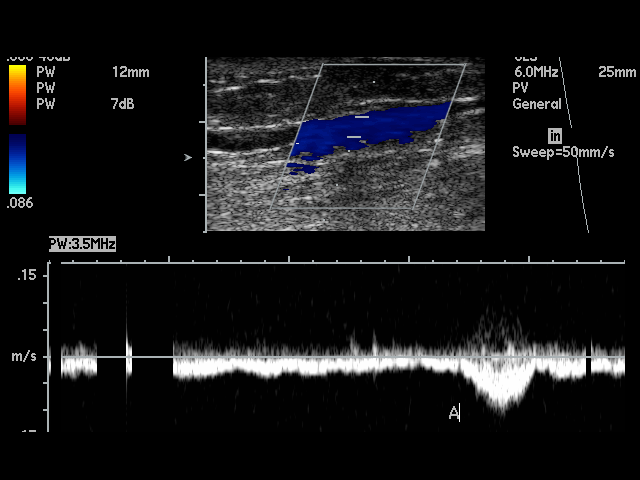
[im 32/36]
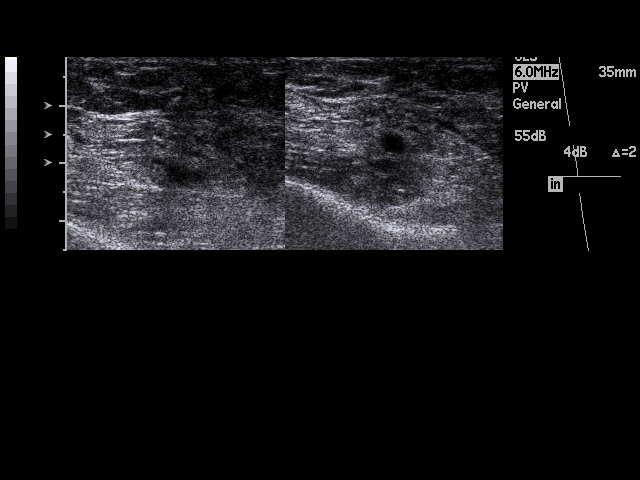
[im 36/36]
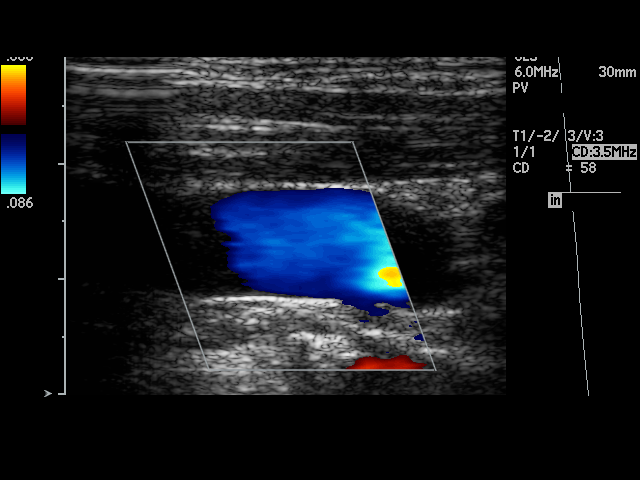

[17 of 24 positions shown; findings below may reference images not displayed]

PROCEDURE:     US  - US DOPPLER UP EXTR LEFT  - April 27, 2011  [DATE]

RESULT:     Left upper of deep venous Doppler ultrasound dated 04/27/2011.

Procedure: Grayscale, color flow, duplex Doppler and spectral waveform
imaging was performed of the deep venous structures of the left upper
extremity.

The internal jugular vein demonstrates diffuse echogenic thrombus and no
evidence of compressibility. This extends into the subclavian vein. These
areas demonstrate no appreciable waveform and absent color flow.

The basilic, cephalic, axillary, brachials, and antecubital veins
demonstrate no evidence of areas of increased echogenicity nor absent
compressibility. Color flow is appreciated within these vessels as well as
appropriate waveform imaging.
IMPRESSION: Occlusive thrombus appreciated within the left internal
jugular vein extending to the subclavian vein.
2. Dr. Juzryll to of the [REDACTED] was informed of these findings
via telephone conversation at [DATE] p.m. on 04/27/2011.

## 2013-07-09 IMAGING — CR DG CHEST 2V
1 series · 2 of 2 positions shown · non-contrast
Comparison: none

REASON FOR EXAM: follow up pulmonary edema
COMMENTS:

[Series 1: view not recorded · 0.17mm/px · 2 of 2 slices shown]
[im 1/2]
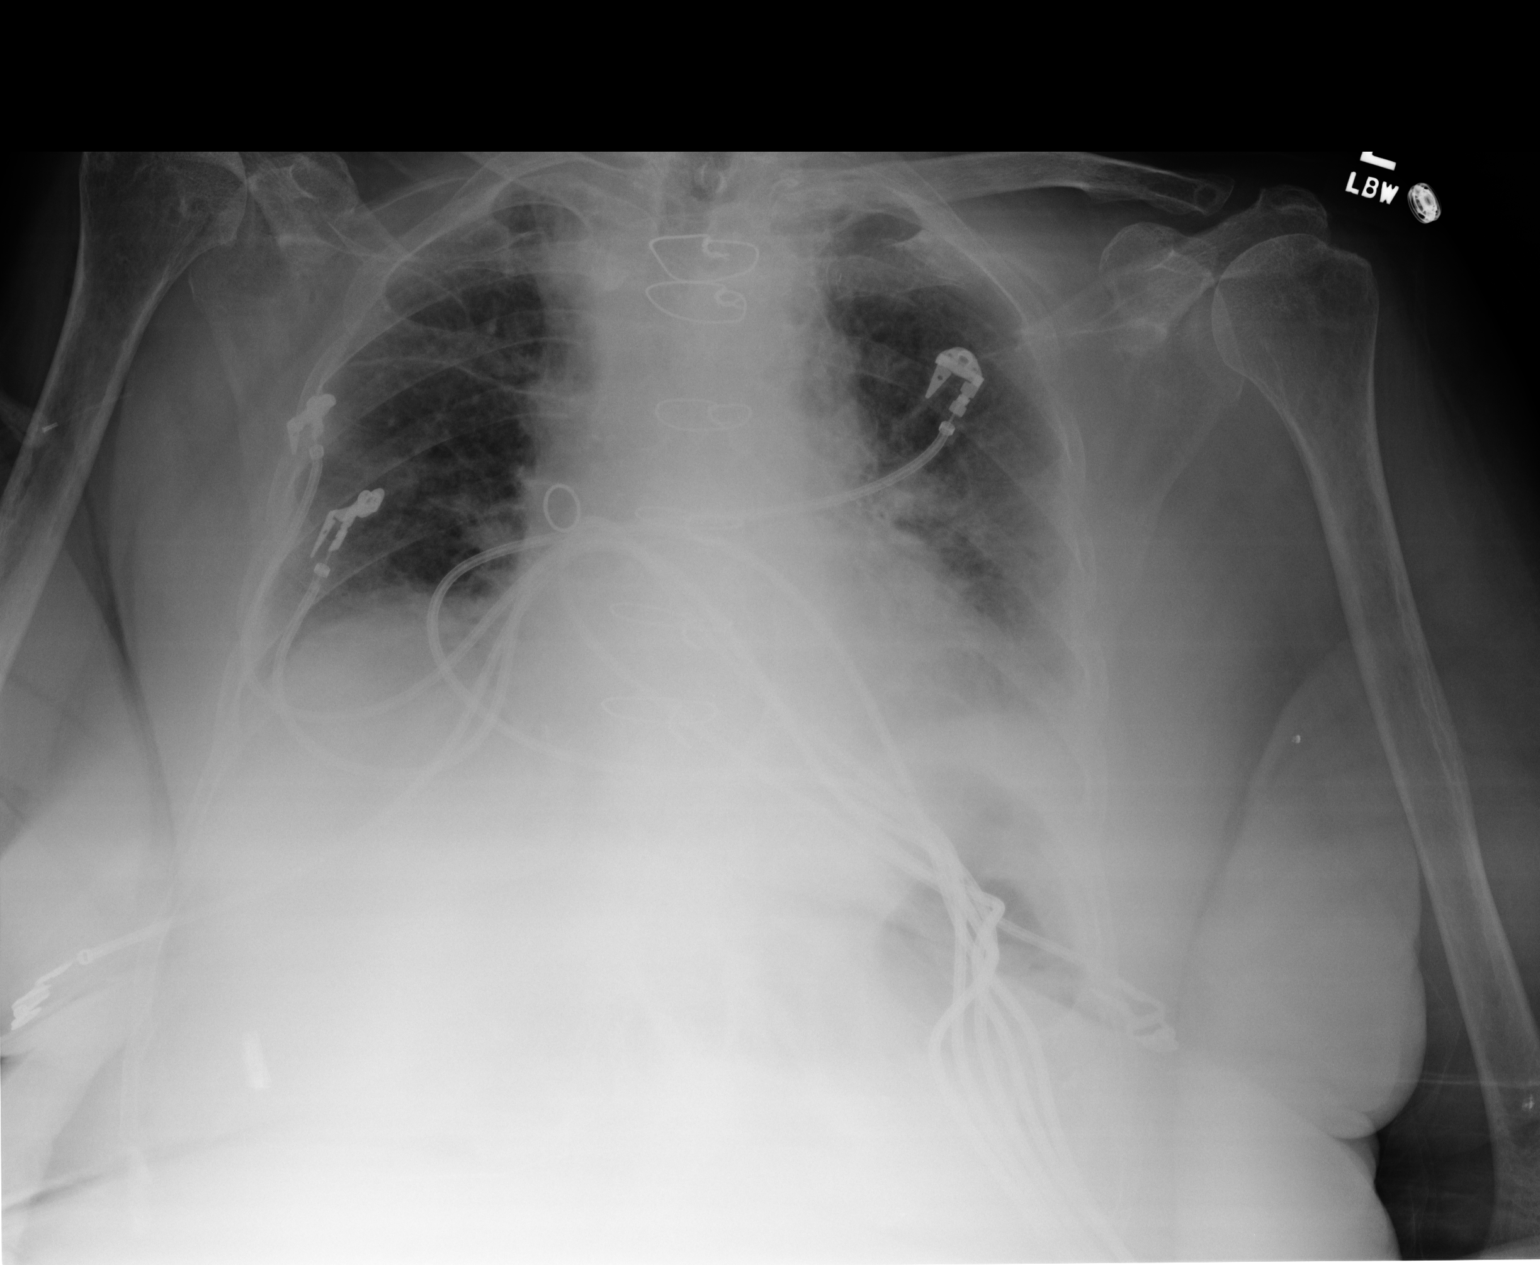
[im 2/2]
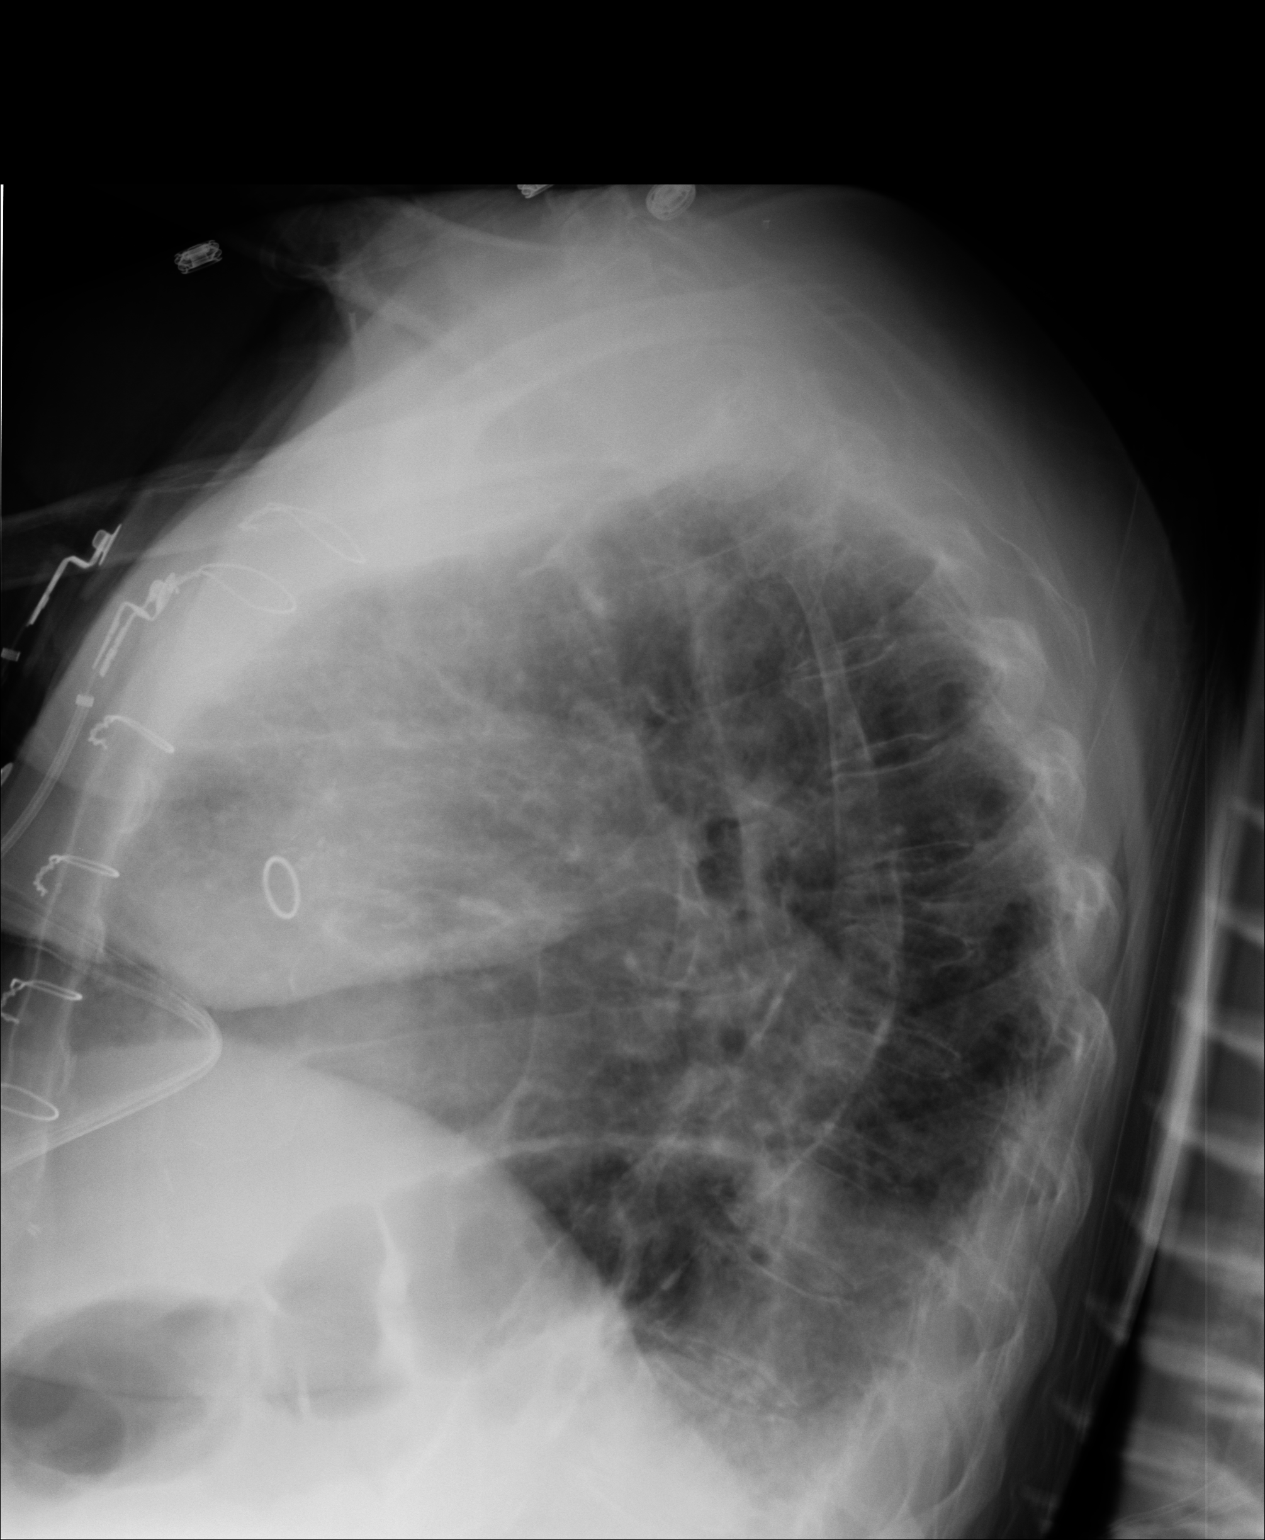

[2 of 2 positions shown; findings below may reference images not displayed]

PROCEDURE:     DXR - DXR CHEST PA (OR AP) AND LATERAL  - April 27, 2011  [DATE]

RESULT:     Comparison is made to the study of 04/26/2011. CABG changes are
present. Cardiac silhouette is enlarged. Small bilateral pleural effusions
are present. There is mild interstitial edema. A definite infiltrate is not
identified.
IMPRESSION: Cardiomegaly with CABG changes and small pleural effusions
possibly with minimal interstitial edema. Correlate for CHF.

## 2013-07-13 IMAGING — CR DG CHEST 1V PORT
1 series · 1 of 1 positions shown · non-contrast
Comparison: none

REASON FOR EXAM: weak
COMMENTS:

PROCEDURE:     DXR - DXR PORTABLE CHEST SINGLE VIEW  - May 01, 2011  [DATE]
RESULT:     Comparison: 04/27/2011

[view not recorded]
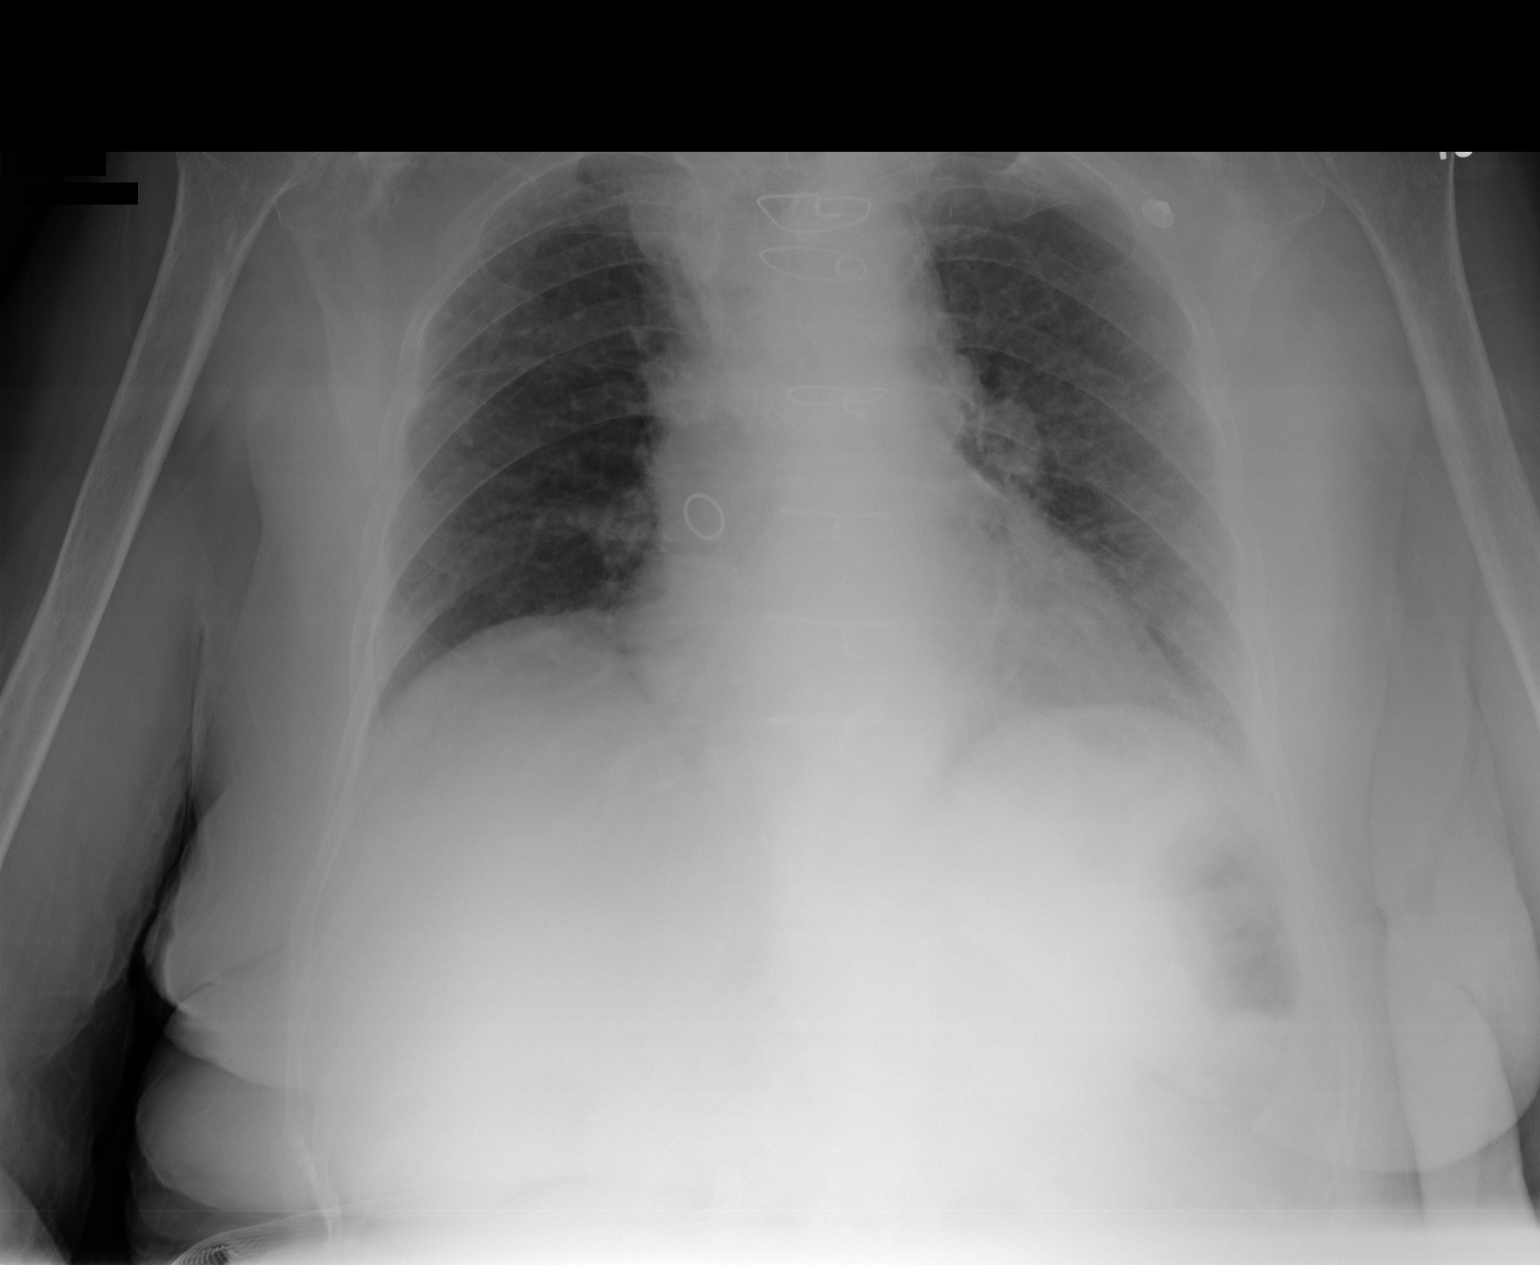

[1 of 1 positions shown; findings below may reference images not displayed]

FINDINGS: Single portable AP chest radiograph is provided. There is evidence of prior
CABG. There is no focal parenchymal opacity, pleural effusion, or
pneumothorax. Stable cardiomediastinal silhouette. The osseous structures
are unremarkable.
IMPRESSION: No acute disease of the chest.

## 2013-07-17 IMAGING — CR DG CHEST 1V PORT
1 series · 1 of 1 positions shown · non-contrast
Comparison: none

REASON FOR EXAM: pna /atx
COMMENTS:

PROCEDURE:     DXR - DXR PORTABLE CHEST SINGLE VIEW  - May 05, 2011  [DATE]
RESULT:     Comparison: 05/01/2011, 04/27/2011

[view not recorded]
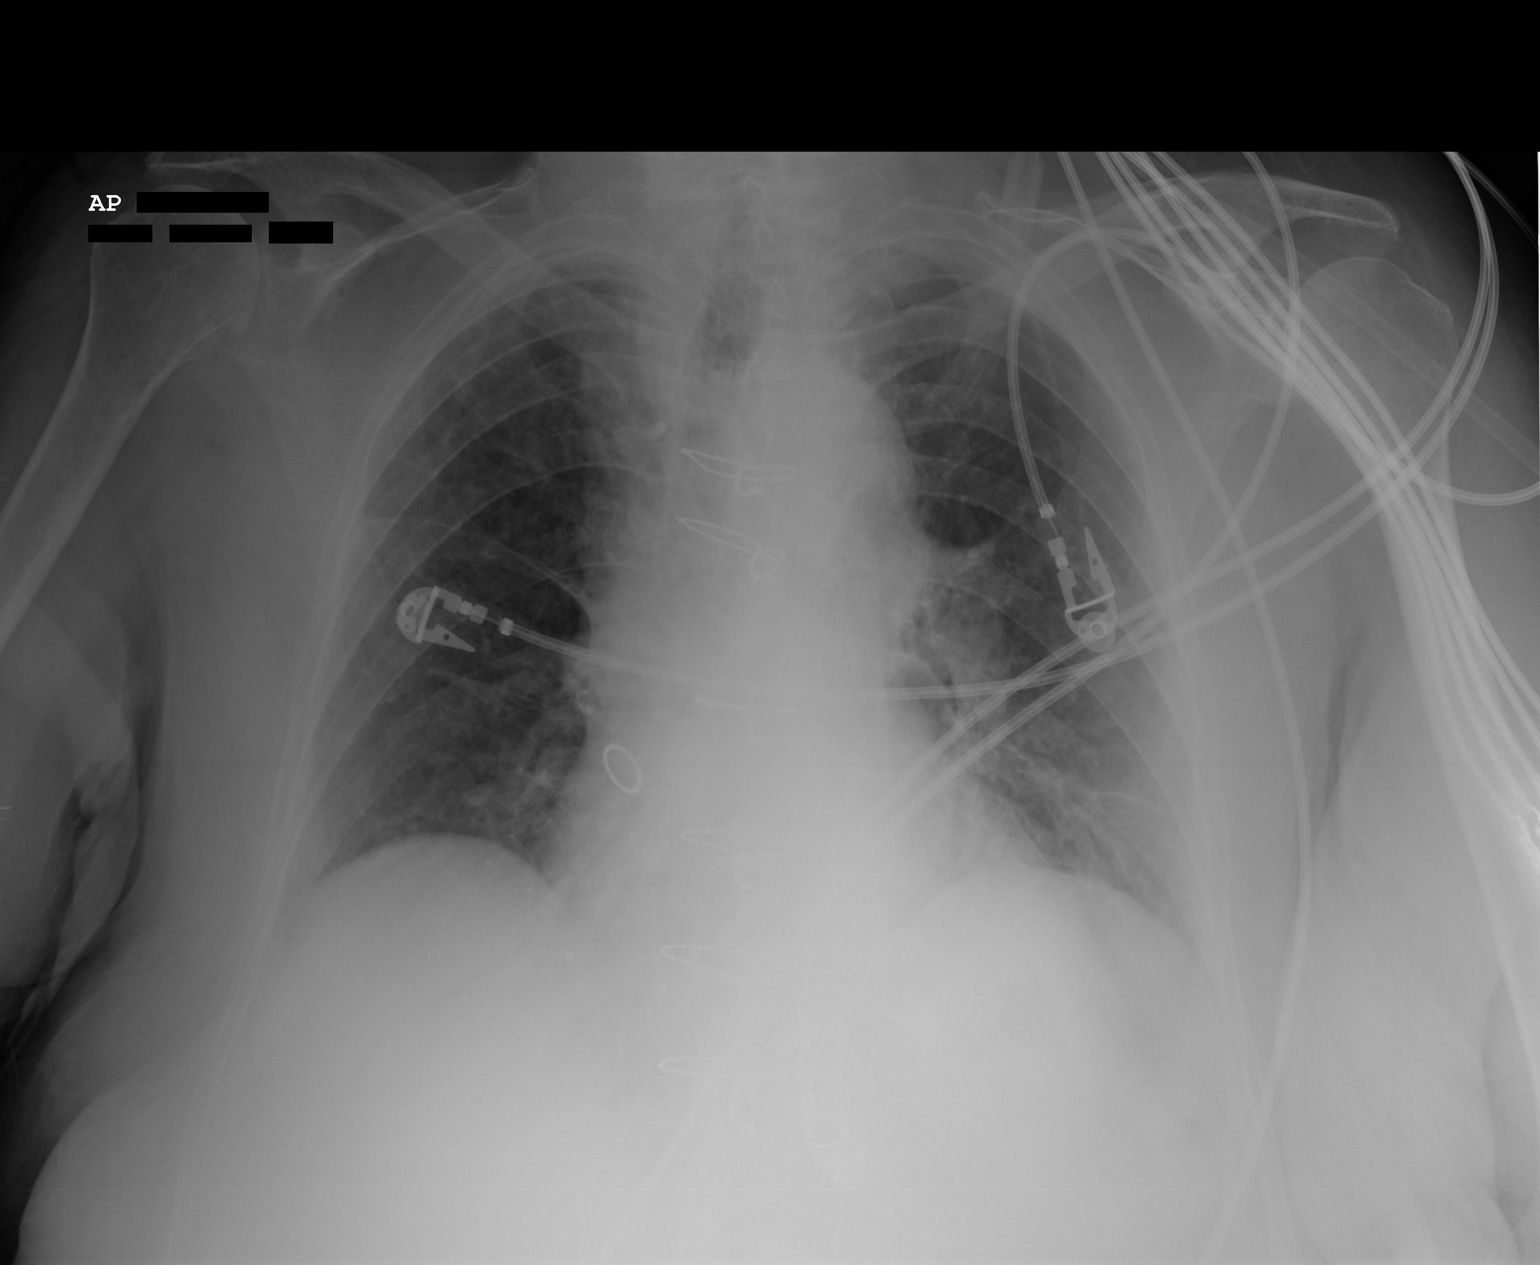

[1 of 1 positions shown; findings below may reference images not displayed]

FINDINGS: Single portable AP chest radiograph is provided. Hazy left lower lobe
airspace disease which may reflect atelectasis versus infiltrate. There is
no other focal parenchymal opacity, pleural effusion, or pneumothorax.
Normal cardiomediastinal silhouette. There is evidence of prior CABG. The
osseous structures are unremarkable.
IMPRESSION: Hazy left lower lobe airspace disease which may reflect atelectasis versus
infiltrate.

## 2014-11-28 NOTE — Op Note (Signed)
PATIENT NAME:  Marissa Newton, Djuna M MR#:  [id number] DATE OF BIRTH:  Oct 14, 1920  DATE OF PROCEDURE:  10/02/2011  PROCEDURE: Insertion of permanent pacemaker.   IMPLANTING PHYSICIAN: Corky DownsJaved Kelvin Sennett, MD   ATTENDING CARDIOLOGIST: Dorothyann Pengwayne Callwood, MD   PREOPERATIVE DIAGNOSIS: Complete heart block with a temporary pacemaker placed on 02/25 by Dr. Juliann Paresallwood functioning well. The patient is pacemaker dependent.   PROCEDURE NOTE: The patient was taken to the operating room on 02/26 and her left arm was swollen because of radiation for non-Hodgkin's lymphoma and chemotherapy so attempt was made to insert the pacemaker from the right side. After proper preparation of the right side of the chest, it was draped in a sterile fashion. Right subclavian vein was entered on the first pass and wire was introduced and wire was located close to the tricuspid valve and then the permanent pacemaker catheter was put in through the introducer sheath and optimum position was obtained at the floor of the right ventricle. Position was checked and then a pocket was created under the superficial fascia and pacemaker was hooked to the pacemaker connections and tightened securely and the pacemaker was put in the pocket. The pacemaker lead was secured with suture sleeve and the pocket was closed. After closure of the pocket with movement of the patient and due to arrhythmia, the pacemaker wire was pushed back and it appeared it was not capturing well so the pocket was opened again and sutures were released and pacemaker lead was positioned again in a different spot where it was thought to be more secure and the pacemaker lead was tied again to the pacemaker, pectoralis major muscle and it was again hooked to the pacemaker making sure that the lead was connected with the pacemaker and then pacemaker was put in the pocket and pocket was closed in two separate sutures. The patient tolerated the procedure well and returned to the recovery room  in a stable condition. Temporary pacemaker was left in position because of any problems in the next 24 hours.   ____________________________ Corky DownsJaved Serrina Minogue, MD jm:drc D: 10/03/2011 19:38:00 ET T: 10/04/2011 08:06:28 ET JOB#: [id number]  cc: Corky DownsJaved Elvis Boot, MD, <Dictator> Corky DownsJAVED Terrill Wauters MD ELECTRONICALLY SIGNED 10/07/2011 22:05

## 2014-11-28 NOTE — H&P (Signed)
PATIENT NAME:  Marissa Newton, Marissa Newton MR#:  [id number] DATE OF BIRTH:  Sep 17, 1920  DATE OF ADMISSION:  11/21/2011  REFERRING PHYSICIAN: Janalyn Harderavid Kaminski, MD  PRIMARY CARE PHYSICIAN: Cay SchillingsJack Wolf, MD  CARDIOLOGIST: Corky DownsJaved Masoud, MD  CHIEF COMPLAINT: Shortness of breath.   HISTORY OF PRESENT ILLNESS: The patient is an 81 year old Caucasian female with chronic oxygen-dependent respiratory failure, systolic congestive heart failure with ejection fraction of 35 to 45%, and recent hospitalizations for pneumonia and complete heart block which required a permanent pacemaker who presents with shortness of breath. The patient states she has been short of breath, progressive, for the past week or so and has woke up two times in the last week during the night gasping for air. She endorses two pillow orthopnea. There are no fevers. There is a nonproductive cough. Occasional wheezing. On arrival she was found with elevated BNP of 17,000. She also was found to be hypokalemic and hospitalist was contacted for further evaluation and management. The patient has no chest pain. There are no sick contacts. X-ray of the chest is negative for pneumonia.   PAST MEDICAL HISTORY:  1. Interstitial lung disease, on 2 liters nasal cannula. 2. Restless leg syndrome.  3. Cardiomyopathy with ejection fraction of 35 to 45% with moderate to severe mitral regurgitation and tricuspid regurgitation. 4. Congestive heart failure. 5. Non-Hodgkin's lymphoma. 6. Hypothyroidism. 7. Port-A-Cath blood clot. 8. Coronary artery disease status post CABG. 9. Hyperlipidemia. 10. Gastroesophageal reflux disease.  11. Decreased vision in left eye secondary to cataract. 12. Chronic left arm lymphedema.   PAST SURGICAL HISTORY:  1. Bypass surgery. 2. Cataract surgery. 3. Portable pacemaker and removal.   MEDICATIONS:  1. Synthroid 100 mcg daily.  2. Lasix 20 mg daily.  3. Kay Ciel 20 mEq twice a day. 4. Neurontin 300 mg at bedtime.   5. Celexa 20 mg daily.  6. Aspirin 81 mg daily.  7. Ferrous sulfate 325 mg 1 tab daily.  8. Norco 5/325 mg 1 tab every eight hours as needed for pain.  9. Albuterol 3 mL inhaled as needed.   ALLERGIES: Ambien, Ativan, Demerol, morphine, Phenergan, sulfa drugs.   SOCIAL HISTORY: Quit smoking multiple years ago. Denies alcohol or drug use. Lives with a grandchild.   FAMILY HISTORY: Mom with pelvic organ cancer. Father died at an early age, per chart.   REVIEW OF SYSTEMS: CONSTITUTIONAL: No fever, fatigue, weakness, or weight changes. EYES: Has cataracts and eye surgery. Denies double vision. Has some blurry vision on the left. ENT: No tinnitus. Has decreased hearing. No snoring or postnasal drip. RESPIRATORY: Cough as above. Occasional wheezing. Some dyspnea on exertion and shortness of breath. No chronic obstructive pulmonary disease. She has interstitial lung disease. CARDIOVASCULAR: No chest pain. Two pillow orthopnea. Occasional lower extremity edema. Has a history of complete heart block status post pacemaker. No palpitations. GI: Denies nausea, vomiting, diarrhea, or abdominal pain. GENITOURINARY: Denies dysuria, hematuria, or incontinence. ENDOCRINE: Denies polyuria or nocturia. Has hypothyroid state. HEME/LYMPH: Has history of anemia and some easy bruising. SKIN: No new rashes. The patient has had easy bruising. NEUROLOGIC: No numbness, feels overall just weak. No dementia or tremor. PSYCH: Denies anxiety. Has some insomnia.     PHYSICAL EXAMINATION:   VITAL SIGNS: Temperature on arrival was 98, pulse rate 87, blood pressure 143/72 and currently 141/81, and oxygen saturation 90% on oxygen 2 liters on arrival and currently she is on 2 liters and saturations are  99%.   GENERAL: The patient is an elderly  female lying in bed in no obvious distress talking in full sentences.   HEENT: Normocephalic, atraumatic. Pupils are equal and reactive. Anicteric sclerae. Dry mucous membranes. Poor  dentition.   NECK: Supple. Some JVD. No thyroid tenderness.   CARDIOVASCULAR: S1 and S2. Positive murmur. No rubs or gallops.   LUNGS: Scattered crackles, more on the left than the right. Good air entry. No wheezing.   ABDOMEN: Soft, nontender, and nondistended. Positive normal active bowel sounds.   EXTREMITIES: Some ecchymosis on the anterior shin, on the right lower extremity. No significant lower extremity edema.   NEUROLOGICAL: Cranial nerves II through XII grossly intact. Strength 5/5 in all extremities. The patient does have some lymphedema on the left upper extremity which is wrapped.   PSYCH: Awake, alert, and oriented x3.  LABS/STUDIES: BNP is elevated at 17,875. Glucose 79, BUN 17, creatinine 0.76, sodium 138, potassium 2.9, chloride 96, CO2 36, and anion gap 6. LFTs within normal limits. Troponin 0.04. CK-MB 1.8. WBC 7.1, hemoglobin 10.5, hematocrit 32.7, and platelets 177.   X-ray of the chest, PA and lateral, is showing mild bibasilar atelectasis. No pneumonia. Slight prominence of pulmonary vasculature suspicious for minimal pulmonary vascular congestion. Cardiac pacemaker.   EKG: Appears poor quality. There is demand pacemaker. Atrial fibrillation with PVC. Left axis deviation. Right bundle branch block.   ASSESSMENT AND PLAN: We have an 81 year old Caucasian female with interstitial lung disease on 2 liters nasal cannula, chronic respiratory failure and chronic systolic congestive heart failure with ejection fraction of 35 to 45%, hyperlipidemia, non-Hodgkin's lymphoma, coronary artery disease status post CABG, and gastroesophageal reflux disease who presents with progressive shortness of breath and dyspnea on exertion, mostly at night when lying flat, with two-pillow orthopnea, elevated BNP, and no fevers. At this point, we will admit the patient for acute on chronic respiratory failure and acute on chronic systolic congestive heart failure. I would not repeat an echo as it  was recently done in the past three months. She has some volume overload on exam with jugular venous distention and crackles in the left lung more than the right lung. She has no significant lower extremity edema. I would start Lasix 20 mg IV twice a day, check ins and outs and daily weights. I would also start the patient on nebulizers as needed. The patient was discharged with Lasix twice a day; however, she is on daily now. Perhaps she needs a higher dose of Lasix. We would continue the oxygen. She is not on a beta blocker or an ACE inhibitor. We might have to start them here as she improves given the congestive heart failure history. We would continue the aspirin and check a lipid profile. In regards to the hypothyroidism, check a TSH and resume the Synthroid. Start the patient on some cough medicine as well. She has no fever or evidence for pneumonia on x-ray of the chest. Would start the patient on heparin for deep vein thrombosis prophylaxis.   CODE STATUS: DO NOT RESUSCITATE.   TOTAL TIME SPENT: 60 minutes.  ____________________________ Krystal Eaton, MD sa:slb D: 11/21/2011 15:43:30 ET T: 11/21/2011 16:43:34 ET JOB#: [id number]  cc: Krystal Eaton, MD, <Dictator> Mickie Hillier. Sheppard Penton, MD Corky Downs, MD Marcelle Smiling [facility] MD ELECTRONICALLY SIGNED 11/23/2011 12:44

## 2014-11-28 NOTE — H&P (Signed)
PATIENT NAME:  Marissa Newton, Marissa Newton MR#:  [id number] DATE OF BIRTH:  09/12/1920  DATE OF ADMISSION:  09/30/2011  REASON FOR ADMISSION: Acute respiratory failure with congestive heart failure associated with profound bradycardia.   HISTORY OF PRESENT ILLNESS: The patient is an [age] year old female recently hospitalized with pneumonia and sepsis who was discharged two days ago. She presents to the Emergency Room today with altered mental status and lethargy with profound bradycardia. In the Emergency Room, the patient was found to be bradycardic, in congestive heart failure. She was given atropine and epinephrine with no improvement of her bradycardia. A transthoracic pacemaker was applied with improvement of her blood pressure and heart rate. She is now admitted for further evaluation.   PAST MEDICAL HISTORY:  1. Recent sepsis.  2. Recent pneumonia.  3. Chronic respiratory failure, on oxygen.  4. Hypothyroidism.  5. Chronic interstitial lung disease.  6. Atherosclerotic cardiovascular disease status post coronary artery bypass graft.  7. Chronic left arm lymphedema.  8. Gastroesophageal reflux disease.  9. History of Hodgkin's lymphoma.  10. Ischemic cardiomyopathy with an ejection fraction of 30%.  11. Restless leg syndrome.   MEDICATIONS:  1. Aspirin 81 mg p.o. daily.  2. Neurontin 300 mg p.o. daily.  3. Levoxyl 0.088 mg p.o. daily.  4. Vicodin 7.5/500 mg one p.o. daily. 5. Temazepam 15 mg p.o. at bedtime p.r.n.  6. Levaquin 500 mg p.o. daily.  7. Celexa 20 mg p.o. daily.  8. Oxygen at 2 liters per minute per nasal cannula.   ALLERGIES: Ambien, Ativan, sulfa, Remeron, morphine, and Phenergan.   SOCIAL HISTORY: The patient quit smoking several years ago. No history of alcohol or drug abuse.   FAMILY HISTORY: Positive for pelvic cancer.   REVIEW OF SYSTEMS: Unable to obtain from the patient.   PHYSICAL EXAMINATION:   GENERAL: The patient is acutely ill-appearing, lethargic, and is  in moderate respiratory distress.   VITAL SIGNS: Vital signs were initially remarkable for a heart rate of 34 with a respiratory rate of 16 and a blood pressure of 80/46 with a temperature of 96.   HEENT: Normocephalic, atraumatic. Pupils are equally round and reactive to light and accommodation. Extraocular movements are intact. Sclerae are anicteric. Conjunctivae are clear. Oropharynx is clear.   NECK: Supple without thyromegaly. JVD is noted to the angle of the jaw.   LUNGS: Decreased breath sounds with rales approximately one third of the way up bilaterally.   CARDIAC: Slow rate with and irregular, irregular rhythm.   ABDOMEN: Soft and nontender with normoactive bowel sounds. No organomegaly or masses were appreciated. No hernias or bruits were noted.   EXTREMITIES: 1+ edema bilaterally.   NEUROLOGIC: Cranial nerves II through XII grossly intact. Deep tendon reflexes were symmetric. Motor and sensory exam is nonfocal.   PSYCH: The patient was lethargic and not responding to questions appropriately.   LABS/STUDIES: EKG revealed bradycardia with a rate of 30 with no acute ischemic changes.   Chest x-ray showed congestive heart failure.   ABG on 2 liters revealed a pO2 of 110 with a saturation of 99%. Troponin was 0.04. TSH was 11.4. Glucose was 119 with a BUN of 44 and a creatinine of 1.78 with a GFR of 29. White count was 9.4 with a hemoglobin of 11.0. BNP was elevated at 18,474.   ASSESSMENT:  1. Complete heart block not responsive to epinephrine and atropine.  2. Acute systolic congestive heart failure.  3. Acute on chronic respiratory failure.  4. Stage III  chronic kidney disease.  5. Anemia of chronic disease.  6. Recent sepsis.  7. Recent pneumonia.  8. Hypothyroidism.   PLAN: The patient is a NO CODE, according to family wishes, but they do want to proceed with pacemaker implant. They have also said that it is okay to use pressors if needed. At this time, we will order a  stat cardiology consult for pacemaker implantation. She will be moved to the Intensive Care Unit. If her blood pressure does not improve, we will begin IV dopamine. We will diuresis with IV Lasix as her blood pressure tolerates. Supplement oxygen at this time and wean as tolerated. Follow serial cardiac enzymes. Follow-up chest x-ray and labs in the morning. We will resume IV antibiotics because of her recent sepsis and pneumonia. We will switch her Synthroid to IV during this acute period. Further treatment and evaluation will depend upon the patient's progress.   TOTAL TIME SPENT ON ADMISSION: 55 minutes.  ____________________________ Duane Lope Judithann Sheen, MD jds:slb D: 09/30/2011 21:54:12 ET     T: 10/01/2011 07:15:23 ET        JOB#: [id number] cc: Duane Lope. Judithann Sheen, MD, <Dictator> Mickie Hillier. Sheppard Penton, MD Arieon Scalzo Rodena Medin MD ELECTRONICALLY SIGNED 10/01/2011 16:58

## 2014-11-28 NOTE — H&P (Signed)
PATIENT NAME:  Marissa Newton, Marissa Newton MR#:  [id number] DATE OF BIRTH:  10/25/1920  DATE OF ADMISSION:  02/16/2012  CHIEF COMPLAINT: Nausea, vomiting, and dyspnea.   HISTORY OF PRESENT ILLNESS: Ms. Marissa Newton is an 81 year old white female with a history of interstitial lung disease with pulmonary fibrosis, O2 dependent, diastolic and systolic combined dysfunction with EF 35% and pulmonary hypertension by 09/17/2010 echo who is currently cared for at home by family with Hospice involvement who presents with two days of nausea and vomiting accompanied by increased work of breathing. The patient denies any sick contacts. She denies any fevers. She does have cough productive of clear phlegm. She was recently treated as an outpatient for a urinary tract infection with presumably Septra per family. She denies any dysuria or frequency currently. She has noted increased cough with eating for a few weeks now and this is a new observation. Her last bowel movement was yesterday which was solid. She has had no recent history of constipation. The patient has been unable to keep down any food or liquid for the last two days. She continues to vomit small amounts of nonbilious-looking liquid in the ER.   PRIMARY CARE DOCTOR: Dr. Cay Schillings   PAST MEDICAL HISTORY: 1. Coronary artery disease, status post CABG.  2. Valvular cardiomyopathy with LV dysfunction, EF 35 to 40%, global hypokinesis, diastolic dysfunction and pulmonary hypertension by February 2012 echo.  3. Interstitial lung disease on 2 liters of O2 chronically.  4. Complete heart block, status post pacemaker implantation.  5. History of non-Hodgkin's lymphoma.  6. History of lymphedema, chronic, left arm.  7. History of hypothyroidism.   MEDICATIONS:  1. Synthroid 100 mcg daily.  2. Norco 5/325 1 tablet every eight hours as needed for pain.  3. Lisinopril 2.5 mg daily.  4. Lasix 20 mg daily.  5. Gabapentin 300 mg at bedtime.  6. Ferrous sulfate 325 mg daily.   7. Celexa 20 mg daily.  8. Aspirin 81 mg daily.  9. Albuterol nebulizers every 4 to 6 hours as needed for wheezing.   PAST SURGICAL HISTORY:  1. CABG. 2. Pacemaker. 3. Bilateral cataracts.   ALLERGIES: Ambien, Ativan, Demerol, morphine, Phenergan, and sulfa.   LAST HOSPITALIZATION: April 2013 at [facility] for shortness of breath.   FAMILY HISTORY: Mother died of some sort of pelvic organ cancer.   SOCIAL HISTORY: She is an ex-smoker having quit many years ago. She lives with her granddaughter and grandson. She is a nondrinker. She is ambulatory with a walker but is able to use a shower using a shower stall.   REVIEW OF SYSTEMS: Negative for fever, weight changes. Positive for chronic pain. Positive for chronic pain in the right eye and redness of both eyes. She does have history of cataracts bilaterally. No tinnitus, ear pain, hearing loss, or seasonal rhinitis. No epistaxis or snoring. No difficulty swallowing. She is edentulous. She has a history of cough, wheezing, and dyspnea. No history of hemoptysis or painful respirations. She denies any recent history of chest pain. She does have occasional orthopnea, usually sleeps on one pillow but occasionally 2 to 3. Denies lower extremity edema or arrhythmia. She does have dyspnea with exertion. No history of syncope. Positive for nausea, vomiting, and abdominal pain x2 days. No diarrhea. Stool has been less frequent since she has been sick. She denies any current frequency or incontinence but did have a recent urinary tract infection that was treated. She denies polyuria or nocturia. She does have a history  of thyroid problems. She denies any easy bruising or bleeding and does have a history of anemia which she takes iron for. She has chronic back and hip pain treated with Norco. She has no history of numbness, weakness, dysarthria, or vertigo. She has had no history of stroke. She has no history of bipolar disorder but does takes Celexa for anxiety and  depression.      PHYSICAL EXAMINATION:   GENERAL: This is an elderly female who appears in no apparent distress. She is sitting upright in bed. She just recently vomited.  VITAL SIGNS: Blood pressure 103/62, pulse 77, temperature 98.2, respirations 18, sating 100% on 4 liters.   HEENT: Pupils are equal and nonreactive. Extraocular movements are intact. Sclerae are nonicteric. She does have considerable erythema surrounding the right orbit both above and below the eye but denies pain. Per daughter the eye was swollen shut this morning prior to transfer to ER.   NECK: Supple without lymphadenopathy, thyromegaly, carotid bruits, or JVD.   LUNGS: Notable for crackles in all lung fields, worse in the right lower lobe.   CARDIOVASCULAR: Regular rate and rhythm with no murmurs, rubs, or gallops.   CHEST: Tender in the epigastric area bilaterally. Chest wall is nontender.   EXTREMITIES: Pedal pulses are palpable. There is no lower extremity edema. She is moving all extremities and able to move against resistance. Gait was not tested.   ABDOMEN: Soft. Bowel sounds are normal. There is no hepatosplenomegaly and there is no rebound or guarding. She is tender to deep palpation in the epigastric region bilaterally.   SKIN: Skin is warm and dry without rashes or lesions except in the left upper and lower eyelids.   LYMPH: There is no cervical, axillary, inguinal, or supraclavicular lymphadenopathy.   NEUROLOGICAL: She is hard of hearing but other cranial nerves appear to be intact. Deep tendon reflexes are decreased bilaterally and symmetric. She is alert and oriented to person and place but not time. She is cooperative.   ADMISSION LABORATORY DATA: Sodium 140, potassium 4.1, chloride 102, bicarb 37, BUN 20, creatinine 0.95, glucose 86. Lipase normal at 98. LFTs normal. Specific gravity of urine is 1.017, trace leukocytes, negative nitrites, 4 white cells, 5 red cells. TSH 2.42. BNP is 17,271. CBC  white count 4.3, hemoglobin 11.5, hematocrit 35.8, platelets 177.   12-lead EKG shows a paced rhythm with a wide QRS complex, left axis deviation and right bundle branch block.   Portable chest x-ray shows pulmonary vascular congestion with fibrosis superimposed and atelectasis versus chronic lingular infiltrate versus fibrosis.   ASSESSMENT AND PLAN:  1. Acute respiratory failure, acute on chronic, secondary to interstitial lung disease plus or minus aspiration pneumonia given recent history of cough, wheezing versus progression of ILD and cardiomyopathy with decompensated congestive heart failure. Will admit. Treat with empiric antibiotics, Lasix, oxygen and follow. She is not currently wheezing so will not start steroids.  2. Dehydration secondary to nausea and vomiting. She has had a modest amount of fluids in the Emergency Room. She is continuing to vomit so will need IV maintenance fluids. We will run these at 75 mL/hr given her EF of 35 to 40%.  3. Cardiomyopathy with acute on chronic systolic dysfunction decompensated secondary to current illness and dehydration. During last admission in April with similar presentation she tolerated IV Lasix at low dose twice daily and this will be attempted here with careful monitoring of renal function and blood pressure.   CODE STATUS. She is DO  NOT RESUSCITATE, DO NOT INTUBATE and daughter has healthcare power-of-attorney and has brought the CODE STATUS certificate to the ER with her.   ESTIMATED TIME OF CARE: 60 minutes.   ____________________________ Duncan Dulleresa Ikram Riebe, MD tt:drc D: 02/16/2012 15:22:55 ET T: 02/16/2012 15:37:32 ET JOB#: [id number]  cc: Duncan Dulleresa Swathi Dauphin, MD, <Dictator> Mickie HillierJack H. Sheppard PentonWolf, MD Duncan DullERESA Scherrie Seneca MD ELECTRONICALLY SIGNED 02/17/2012 11:42

## 2014-11-28 NOTE — Consult Note (Signed)
PATIENT NAME:  Marissa Newton, Marissa Newton MR#:  [id number] DATE OF BIRTH:  Sep 05, 1920  DATE OF CONSULTATION:  10/01/2011  REFERRING PHYSICIAN:   CONSULTING PHYSICIAN:  Corky DownsJaved Yotam Rhine, MD  HISTORY OF PRESENT ILLNESS: Tommye StandardJosephine Juday was seen in consultation in Intensive Care Unit for a complete heart block. She underwent insertion of temporary pacemaker to the right femoral vein by Dr. Juliann Paresallwood yesterday and patient is pacemaker dependent at a rate of 40 so she needs a permanent pacemaker. Patient is alert, responsive. Denies any chest pain, nausea, vomiting, or shortness of breath. Patient has a history of pneumonia and chronic respiratory failure, hypothyroidism, and has a history of bypass surgery. Also has a history of left arm lymphedema due to Hodgkin disease when the Port-A-Cath was put in and caused lymphedema. There is history of ischemic cardiomyopathy with ejection fraction of 30%. For the rest of details of the history and physical, please see typed history and physical sheet.   PHYSICAL EXAMINATION:  GENERAL: Patient is alert and cooperative in no acute distress.   HEENT: Unremarkable. Head is normocephalic.   NECK: Supple. Jugular venous pressure is elevated about 4 cm over the sternal angle.   CARDIOVASCULAR: On examination of the chest both heart sounds are normal. No murmur is audible. Left forearm and arm has lymphedema.   CHEST: Decreased breath sounds.   ABDOMEN: Soft, nontender without any hepatosplenomegaly.   LABORATORY, DIAGNOSTIC AND RADIOLOGICAL DATA: EKG shows bradycardia with a heart rate of 30. Chest x-ray: Congestive heart failure. Right now the telemetry showed temporary pacemaker functioning well.   IMPRESSION:  1. Complete heart block. 2. Acute systolic congestive heart failure left ventricle. 3. Acute on chronic respiratory failure.  4. Stage III chronic kidney disease. 5. Anemia of chronic disease. 6. Recent pneumonia. 7. Hypothyroidism.   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 She is agreeable for the procedure. I informed the daughter the procedure is rather risky because of her congestive heart failure, complete heart block, chronic obstructive pulmonary disease and involvement of the venous system with Hodgkin's disease. They are agreeable to take the risk and the procedure will be scheduled tomorrow.   ____________________________ Corky DownsJaved Ameya Kutz, MD jm:cms D: 10/01/2011 18:32:56 ET T: 10/02/2011 10:29:27 ET JOB#: [id number]  cc: Corky DownsJaved Yasmin Dibello, MD, <Dictator> Corky DownsJAVED Kanyla Omeara MD ELECTRONICALLY SIGNED 10/07/2011 22:05

## 2014-11-28 NOTE — Discharge Summary (Signed)
PATIENT NAME:  Marissa Newton, Marissa Newton MR#:  [id number] DATE OF BIRTH:  Nov 14, 1920  DATE OF ADMISSION:  09/25/2011 DATE OF DISCHARGE:  09/28/2011  ADMITTING PHYSICIAN: Enid Baas, MD  DISCHARGING PHYSICIAN: Enid Baas, MD  PRIMARY CARE PHYSICIAN: Cay Schillings, MD  CONSULTANTS: None.  DISCHARGE DIAGNOSES:  1. Sepsis from pneumonia.  2. Acute on chronic respiratory failure secondary to pneumonia.  3. Streptococcus pneumonia bacteremia, repeat blood culture negative.  4. Hypothyroidism.  5. Chronic interstitial lung disease, on 2 liters home oxygen. 6. Coronary artery disease status post bypass graft surgery.  7. Chronic left arm lymphedema.  8. Gastroesophageal reflux disease.  9. History of Hodgkin's lymphoma.  10. Ischemic cardiomyopathy with ejection fraction of 30%.  11. Restless leg syndrome.   DISCHARGE HOME MEDICATIONS:  1. Aspirin 81 mg p.o. daily.  2. Gabapentin 300 mg p.o. daily.  3. Levoxyl 88 mcg p.o. daily.  4. Acetaminophen/hydrocodone 500/7.5 mg p.o. daily.  5. Temazepam 15 mg p.o. at bedtime p.r.n.  6. Levaquin 500 mg p.o. daily until 10/10/2011.  7. Celexa 20 mg p.o. daily.  DISCHARGE HOME OXYGEN: 2 liters.   DISCHARGE DIET: Low-sodium diet.   DISCHARGE ACTIVITY: As tolerated.   FOLLOWUP INSTRUCTIONS:  1. Follow up with PCP in one week. 2. Resume hospice services.  DISCHARGE LABS/STUDIES: WBC 5.6, hemoglobin 10.4, hematocrit 32.0, platelet count 193.   Sodium 138, potassium 5.0, chloride 99, bicarbonate 26, BUN 31, creatinine 0.71, glucose 78, calcium 8.4.  Troponin has remained stable at 0.04.   LDL 82, HDL 39, total cholesterol 213, triglycerides 69.   Urinalysis: 1+ bacteria, few WBCs, trace leukocyte esterase.   First blood culture grew Streptococcus pneumoniae that was sensitive to Levaquin, penicillin, vancomycin, and ceftriaxone.  The second set of blood culture is negative.   BNP was elevated on admission to 29,000.   Chest  x-ray showed worsening of her left lung, mid and lower lung infiltrates and stable chronic interstitial findings.   BRIEF HOSPITAL COURSE: Marissa Newton is an 81 year old elderly Caucasian female with past medical history significant for chronic respiratory failure secondary to interstitial lung disease, on 2 liters home oxygen, who was recently in the hospital and discharged in November 2013 to home with hospice, on oxygen, after a bout of pneumonia. She was brought back secondary to hypoxic respiratory failure, was hypotensive and had leukocytosis. 1. Acute hypoxic respiratory failure with clinical sepsis picture secondary to left mid and lower lobe pneumonia: Chest x-ray was difficult to interpret with her underlying chronic interstitial disease, but there has definitely been an increase in the infiltrate seen on the left side. She has a white count. She was hypotensive. With her recent treatment of antibiotics, she was placed on linezolid and Levaquin for broader coverage. Her first set of blood culture showed she was growing Streptococcus pneumoniae which was sensitive to quite a few antibiotics and the second blood culture done on the same day has been negative, for more than 36 hours now. She responded with the antibiotics. Clinically she has been down to 2 liters as her home oxygen amount. Her dyspnea and also cough have improved. Her white count has been normalized and blood pressure has improved. Overall she started to feel better and did work with physical therapy. She is generally weak and has some trouble walking with her peripheral neuropathy and weakness, but the patient has expressed in the past too that she would not want to go to any skilled nursing facility or rehab and wanted to go  home with hospice. Family is also in agreement with her decision and she will be discharged home with hospice services again, on Levaquin antibiotic, because of the sensitivities.  2. History of ischemic  cardiomyopathy, congestive heart failure: She was actually hypotension on admission and clinically dry, although her BNP was elevated. Her blood pressure responded well to IV fluids and her Lasix is being suspended at the time of discharge. She can be reevaluated by her PCP and Lasix can be restarted. Her other home medications have been continued while in the hospital.  CODE STATUS: DO NOT RESUSCITATE.   DISCHARGE CONDITION: Stable.   DISCHARGE DISPOSITION: Home with hospice.   TIME SPENT ON DISCHARGE: 45 minutes. ____________________________ Enid Baasadhika Laythan Hayter, MD rk:slb D: 09/28/2011 16:00:55 ET T: 09/29/2011 12:08:25 ET JOB#: [id number]  cc: Enid Baasadhika Biviana Saddler, MD, <Dictator> Mickie HillierJack H. Sheppard PentonWolf, MD Enid BaasADHIKA Saed Hudlow MD ELECTRONICALLY SIGNED 10/05/2011 10:41

## 2014-11-28 NOTE — Discharge Summary (Signed)
PATIENT NAME:  Marissa Newton, Marissa Newton MR#:  [id number] DATE OF BIRTH:  1921/07/13  DATE OF ADMISSION:  09/30/2011 DATE OF DISCHARGE:  10/05/2011  DISCHARGE DIAGNOSES: 1. Complete heart block status post permanent pacemaker placement on 09/12/2011. 2. Possible acute systolic congestive heart failure exacerbation. Ejection fraction is 35 to 45%.  3. Acute on chronic respiratory failure. 4. Acute renal failure. 5. Anemia of chronic disease. 6. Recent sepsis with pneumonia.  7. Hypothyroidism.  8. Reactive hyperglycemia.  9. Elevated troponin due to demand ischemia from hypertension.   DISPOSITION: The patient is being discharged to a rehab facility.   DIET: Low sodium, mechanical soft with pureed meats.   ACTIVITY: As tolerated. Continue physical therapy.  DISCHARGE FOLLOWUP: Follow up with Dr. Corky DownsJaved Masoud and Dr. Sheppard PentonWolf in 1 to 2 weeks after discharge.   DISCHARGE MEDICATIONS:  1. Synthroid 100 mcg daily.  2. Tylenol 650 mg every six hours p.r.n.  3. Levaquin 500 mg every 48 hours x2 doses. 4. Lasix 20 mg twice a day. Hold for systolic blood pressure less than 100. 5. Zofran ODT 4 mg every six hours p.r.n.  6. Xopenex/Atrovent 1.25 mg/0.5 mg nebulizer every 6 hours p.r.n. shortness of breath.  7. Oxygen 2 liters via nasal cannula as needed.  8. Aspirin 81 mg daily.  9. Neurontin 300 mg daily.  10. Citalopram 20 mg daily.  CONSULTANTS:  1. Corky DownsJaved Masoud, MD - Cardiology. 2. Dorothyann Pengwayne Callwood, MD - Cardiology.  RESULTS: 2-D echocardiogram: Left ventricular systolic function is reduced. Ejection fraction 35% to 45%. Normal left ventricular wall thickness. Moderate global hypokinesis of the left ventricle. The right ventricle is grossly normal. Moderate to severe MR. Moderate to severe TR. Right ventricular systolic pressure is elevated at 40 to 50.  Urinalysis showed no evidence of infection.   CBC normal other than hemoglobin of 9.8. Creatinine 1.78 on admission and 1.36 by the time of  discharge. BNP 18,474. Mildly elevated cardiac enzymes, 0.07. Normal LFTs. TSH 11.4.   HOSPITAL COURSE: The patient is an 81 year old female with past medical history of chronic respiratory failure, recent admission for sepsis and pneumonia, hypothyroidism, chronic interstitial lung disease, coronary artery disease status post coronary artery bypass graft, chronic left arm lymphedema, gastroesophageal reflux disease, Hodgkin's lymphoma, ischemic cardiomyopathy, and restless leg syndrome who presented with change in mental status, bradycardia, and hypertension. She was found to have complete heart block. A cardiology consultation with Dr. Juliann Paresallwood was obtained, and the patient underwent temporary pacemaker placement initially and was monitored in the Intensive Care Unit. After placement of the temporary pacemaker, the patient's bradycardia resolved and her blood pressure improved. She subsequently underwent a permanent pacemaker, on 10/02/2011, by Dr. Corky DownsJaved Masoud. Currently she is paced and hemodynamically stable. She also has acute systolic congestive heart failure exacerbation with BNP 18,474 and pulmonary vascular congestion. An echo was repeated and showed an ejection fraction of 35 to 45%. Initially she was treated with high-dose Lasix and currently she is being discharged on Lasix 20 mg twice a day. She had acute on chronic respiratory failure likely due to a combination of acute systolic congestive heart failure and underlying pneumonia. She was treated with nebulizer treatments, oxygen therapy, and empiric antibiotics. She had some renal insufficiency/acute renal failure. Because her creatinine during last admission was normal, this was felt to be due to ATN due to hypotension resulting from symptomatic bradycardia. With conservative management, the patient's creatinine improved. Her TSH was found to be elevated and the dose of her Synthroid  has been increased. She had some elevated troponin and this was  felt to be due to demand ischemia from hypertension. The patient was living at home with hospice and was desirous of returning home. However, she was physically very deconditioned. After an extensive discussion with the patient and her family, they have decided to go to rehab where the patient is being discharged in a stable condition.   TIME SPENT: 45 minutes.  ____________________________ Darrick Meigs, MD sp:slb D: 10/05/2011 10:27:14 ET T: 10/05/2011 11:04:22 ET JOB#: [id number]  cc: Darrick Meigs, MD, <Dictator> Darrick Meigs MD ELECTRONICALLY SIGNED 10/05/2011 19:21

## 2014-11-28 NOTE — Discharge Summary (Signed)
PATIENT NAME:  Twana FirstCOULON, Kalla M MR#:  [id number] DATE OF BIRTH:  04-12-1921  DATE OF ADMISSION:  11/21/2011 DATE OF DISCHARGE:  11/23/2011  For a detailed note, please take a look at the history and physical done on admission by Dr. Jacques NavyAhmadzia.   DIAGNOSES AT DISCHARGE:  1. Acute on chronic respiratory failure likely secondary to underlying congestive heart failure. 2. History of congestive heart failure secondary to combined diastolic and systolic dysfunction. 3. Interstitial lung disease on home oxygen. 4. Hypothyroidism.  5. Chronic pain. 6. History of chronic anemia.   DIET: The patient is being discharged on a low sodium diet.   ACTIVITY: As tolerated.   REFERRALS: The patient is to resume her home health nursing and physical therapy services.   FOLLOW-UP: Follow-up in the next 1 to 2 weeks with Dr. Cay SchillingsJack Wolf.    DISCHARGE MEDICATIONS:  1. Aspirin 81 mg daily.  2. Synthroid 100 mcg daily.  3. Lasix 20 mg daily.  4. Iron sulfate 325 mg daily.  5. Gabapentin 300 mg at bedtime. 6. Albuterol inhaler as needed.  7. Celexa 20 mg daily. 8. Norco 5/325 1 tab q.8 hours as needed.  9. Lisinopril 2.5 mg daily.   PERTINENT STUDIES DONE DURING THE HOSPITAL COURSE: Chest x-ray done on admission showing mild bibasilar atelectasis. No pneumonia. Slight prominence of pulmonary vascular suspicious for minimal pulmonary vascular congestion. A cardiac pacemaker is present.   HOSPITAL COURSE: This is an 81 year old female with medical problems as mentioned above who presented to the hospital on 11/21/2011 secondary to some nonproductive cough, shortness of breath for about a week.  1. Acute on chronic respiratory failure. This was likely secondary thought to be related to her underlying interstitial lung disease with some mild CHF. The patient was started on IV diuresis with IV Lasix b.i.d. She has diuresed well with IV Lasix over the past 24 to 48 hours. Clinically she feels much better. She  will resume her Lasix dose as mentioned above.  2. Interstitial lung disease, on home oxygen. This was likely also the cause of the patient's underlying shortness of breath but this is chronic in nature. She is already on oxygen and she will resume that upon discharge.  3. Hypokalemia. The patient's potassium was supplemented and has improved prior to discharge. It was 2.9 at admission and on day of discharge is 4.1.  4. Hypothyroidism. The patient was maintained on her Synthroid. She will resume that.  5. Chronic pain. The patient is maintained on Norco. She will resume that.  6. Depression. The patient was maintained on Celexa. She will resume that upon discharge too.   7. History of congestive heart failure. This is likely combined systolic and diastolic dysfunction. The patient already is on diuretics and she will resume that. She had a recent echocardiogram done in February of this past year which showed EF of about 35%. She is not on any meds for her CHF. I did start her on some low dose lisinopril upon discharge.   CODE STATUS: The patient is a DO NOT INTUBATE, DO NOT RESUSCITATE.   DISPOSITION: She is being discharged home with home health nursing and PT services.   TIME SPENT: 40 minutes.  ____________________________ Rolly PancakeVivek J. Cherlynn KaiserSainani, MD vjs:drc D: 11/23/2011 15:08:00 ET T: 11/24/2011 16:28:15 ET JOB#: [id number]  cc: Rolly PancakeVivek J. Cherlynn KaiserSainani, MD, <Dictator> Mickie HillierJack H. Sheppard PentonWolf, MD  Houston SirenVIVEK J SAINANI MD ELECTRONICALLY SIGNED 11/27/2011 12:19

## 2014-11-28 NOTE — H&P (Signed)
PATIENT NAME:  Marissa Newton, [name] MR#:  [id number] DATE OF BIRTH:  [birth year]  DATE OF ADMISSION:  09/25/2011  CHIEF COMPLAINT: Can't  walk.   HISTORY OF PRESENT ILLNESS: This is an [age] year old female who is presenting with weakness, she has been coughing for three weeks whitish thick phlegm, her right ear hurts, she is short of breath, she is wheezing. She took the rest of her Z-Pak today. This morning she was disoriented, was recently started on a temazepam pill but very weak at home. She had a fever of 99.3 and some chills. In the Emergency Room she was found to have a left lower lobe pneumonia and a right otitis media. Pulse oximetry 87% on room air. Hospitalist services were contacted for further evaluation and the patient was also hypotensive with a blood pressure of systolic 85.   PAST MEDICAL HISTORY:  1. Interstitial lung disease seen on a CAT scan. 2. Restless leg syndrome. 3. Cardiomyopathy with an ejection fraction of 30% with severe mitral regurgitation. 4. Congestive heart failure.  5. Non-Hodgkin's lymphoma x2 with chemotherapy and radiation. 6. Hypothyroidism. 7. Port-A-Cath blood clot. 8. Coronary artery disease.  9. Hyperlipidemia.  10. Gastroesophageal reflux disease.   PAST SURGICAL HISTORY:  1. Bypass surgery. 2. Cataract surgery. 3. Port placement and removal.   MEDICATIONS:  1. Synthroid 88 mcg daily.  2. Lasix 40 mg twice a day.  3. KCl 20 mEq b.i.d.  4. Neurontin 300 mg at bedtime.  5. Temazepam 15 mg at bedtime.  6. Celexa 40 mg daily.  7. Hydrocodone 7.5/500 p.r.n. pain. 8. Aspirin 81 mg daily.   SOCIAL HISTORY: Quit smoking a long time ago. No alcohol. No drug use. Lives with her grandchild.   ALLERGIES: Ambien, Ativan, Remeron, morphine, Phenergan and sulfa.   FAMILY HISTORY: Father was killed at an early age. Mother died at 4448 of cancer of the pelvic organs.    REVIEW OF SYSTEMS: CONSTITUTIONAL: Positive for fever 99.3. Positive for chills. No  sweats. Positive for gradual weight loss of 50 pounds over the last six months. Positive for weakness. EYES: She does have cataracts and glasses. EARS, NOSE, MOUTH, AND THROAT: Decreased hearing, pain in the right ear. Positive for sore throat, runny nose, pain in the right ear and decreased hearing. CARDIOVASCULAR: No chest pain. No palpitations. RESPIRATORY: Positive for shortness of breath. Positive for coughing. Positive for wheezing. GASTROINTESTINAL: No nausea. No vomiting. No abdominal pain. No diarrhea. No constipation. No bright red blood per rectum. No melena. GENITOURINARY: No burning on urination. No hematuria. MUSCULOSKELETAL: Positive for leg pain. NEUROLOGIC: No fainting or blackouts. PSYCHIATRIC: Positive for anxiety, depression. ENDOCRINE: Positive for hypothyroidism. HEMATOLOGIC/LYMPHATIC: No anemia, no easy bruising or bleeding.   PHYSICAL EXAMINATION:  VITAL SIGNS: Vital signs on presentation include: Pulse oximetry 87% on room air, blood pressure 83/57, pulse 96, respirations 22, temperature 98.5.   GENERAL: No respiratory distress, coughing quite a bit.   EYES: Conjunctivae and lids normal. Pupils equal, round, and reactive to light. Extraocular muscles intact. No nystagmus.   EARS, NOSE, MOUTH, AND THROAT: Right tympanic membrane bulging, erythematous. Left tympanic membrane unable to be visualized secondary to small canal. Throat no erythema. No exudate seen. Lips and gums no lesions.   NECK: No JVD. No bruits. No lymphadenopathy. No thyromegaly. No thyroid nodules palpated.   RESPIRATORY: No use of accessory muscles to breathe. Positive rhonchi in the left lung base. No rales or wheeze heard.   CARDIOVASCULAR: S1, S2 normal. Carotid upstroke 2+  bilaterally. No bruits. Positive 3/6 systolic ejection murmur.   EXTREMITIES: Dorsalis pedis pulses 1+ bilaterally. Legs 2+ edema bilateral lower extremities.   ABDOMEN: Soft, nontender. No organomegaly/splenomegaly. Normoactive  bowel sounds.  LYMPHATIC: Positive for lymphedema of the left arm. No lymph nodes in the neck.   MUSCULOSKELETAL: 2+ edema bilateral lower extremity. No cyanosis on oxygen.   SKIN: Chronic lower extremity discoloration, brownish.   NEUROLOGIC: Cranial nerves II through XII grossly intact. Deep tendon reflexes 1+ bilateral lower extremities.   PSYCHIATRIC: Patient is oriented to person, place, and time.   LABORATORY, DIAGNOSTIC, AND RADIOLOGICAL DATA: EKG shows a right bundle branch block, left axis, left ventricular hypertrophy, Q waves inferiorly. BNP 29,710, CPK 78. White blood cell count 11.6, hemoglobin and hematocrit 13.5 and 40.8, platelet count 179, glucose 111, BUN 17, creatinine 0.85, sodium 132, potassium 4.1, chloride 96, CO2 27, calcium 8.9. Liver function tests normal range. Albumin low at 3.1. AST slightly elevated at 41. Troponin negative.   ASSESSMENT AND PLAN:  1. Acute respiratory failure with pulse oximetry of 87% on room air. Will give oxygen supplementation, most likely secondary to pneumonia.  2. Hypotension. Most likely with clinical sepsis with pneumonia. Will get blood cultures x2. Give a fluid bolus and hold Lasix at this point.  3. Clinical sepsis with left lower lobe pneumonia seen on chest x-ray, tachycardia with a pulse of 96, leukocytosis with a white count of 11.6, also with otitis media on the right. Since the patient has been in the hospital within the last three months will get a little more aggressive with antibiotics. The pneumonia was also in the same spot last time. Looking back at old reports the patient did have a CT scan of the chest back in November 2012 that showed chronic interstitial lung disease and no mass. Will get blood cultures x2. DuoNeb nebulizer solution. Since the patient recently had a Z-Pak will give Levaquin and will also give Zyvox for more aggressive coverage since the patient has recently been in the hospital. Will obtain a sputum culture.   4. For the patient's history of congestive heart failure, no signs of congestive heart failure at this point. Patient is hypotensive. Need to hold Lasix at this point. Will give one fluid bolus and then stop. Need to watch fluid status closely.  5. Hypothyroidism. Continue Synthroid.  6. History of coronary artery disease on aspirin.  7. Lymphedema of the left upper extremity. No blood draws there.  8. CODE STATUS: Patient is a DO NOT RESUSCITATE, followed by hospice. Will try to keep out of the Intensive Care Unit.   TIME SPENT ON ADMISSION: 55 minutes.   ____________________________ Herschell Dimes. Renae Gloss, MD rjw:cms D: 09/25/2011 17:02:27 ET T: 09/26/2011 05:29:25 ET JOB#: [id number]  cc: Herschell Dimes. Renae Gloss, MD, <Dictator> Salley Scarlet MD ELECTRONICALLY SIGNED 09/28/2011 16:01

## 2014-11-28 NOTE — Discharge Summary (Signed)
PATIENT NAME:  Marissa Newton, Marissa Newton MR#:  [id number] DATE OF BIRTH:  1921/05/23  DATE OF ADMISSION:  02/16/2012 DATE OF DISCHARGE:  02/17/2012  For a detailed note, please take a look at the history and physical done by Dr. Darrick Huntsmanullo on admission.   DIAGNOSES AT DISCHARGE:  1. Nausea and vomiting likely related to suspected viral illness, now resolved.  2. Acute on chronic respiratory failure. 3. History of pulmonary fibrosis. 4. Hypertension. 5. Hypothyroidism. 6. Depression.  7. Dehydration, now resolved.   DIET: Patient is being discharged on a low sodium, low fat diet.   ACTIVITY: As tolerated.   FOLLOW UP: Follow up is with Dr. Cay SchillingsJack Wolf in the next 1 to 2 weeks.   DISCHARGE MEDICATIONS:   1. Albuterol nebulizer q.6 hours as needed.  2. Aspirin 81 mg daily.  3. Celexa 40 mg daily.  4. Gabapentin 300 mg daily.  5. Lasix 20 mg daily.  6. Levaquin 500 mg daily for 10 days.  7. Tylenol with hydrocodone as needed for pain.  8. Synthroid 100 mcg daily.  9. Temazepam 15 mg 1 to 2 tabs at bedtime. 10. Prochlorperazine 10 mg q.6 hours as needed.   LABORATORY, DIAGNOSTIC AND RADIOLOGICAL DATA: Chest x-ray done on 07/13 showing shallow inspiration, pulmonary vascular congestion with possible underlying component of pulmonary fibrosis.   HOSPITAL COURSE: This is an 81 year old female with multiple medical problems as mentioned above presented to the hospital secondary to nausea, vomiting, and some shortness of breath.  1. Nausea, vomiting. I suspect this was likely secondary to a viral illness which has now resolved. She was brought into the hospital, gently hydrated with IV fluids, given supportive care with antiemetics and given a clear liquid diet. Her symptoms the morning after admission have significantly improved. She has had no further episodes of nausea, vomiting, or diarrhea overnight. I advanced her diet to a regular diet which she has been able to tolerate with no other  exacerbations with nausea, vomiting.  2. Shortness of breath. This is acute on chronic respiratory failure secondary to pulmonary fibrosis and mild congestive heart failure. She did receive some IV diuretics. Her clinical symptoms and shortness of breath have significantly improved. She currently is being discharged back on her p.o. Lasix and beta blockers as mentioned. She did not have any bronchospasm and wheezing therefore was not given any steroids.  3. Depression. Patient was maintained on Celexa, she will resume that.  4. Hypothyroidism. Patient was maintained on her Synthroid and she will resume that upon discharge too.  5. Hypertension. She remained hemodynamically stable on her Coreg and she will resume that upon discharge.  6. Patient is being discharged home with hospice services.   TIME SPENT WITH DISCHARGE: 40 minutes.  ____________________________ Rolly PancakeVivek J. Cherlynn KaiserSainani, MD vjs:cms D: 02/17/2012 14:49:54 ET T: 02/17/2012 15:31:12 ET JOB#: [id number]  cc: Rolly PancakeVivek J. Cherlynn KaiserSainani, MD, <Dictator> Mickie HillierJack H. Sheppard PentonWolf, MD Houston SirenVIVEK J SAINANI MD ELECTRONICALLY SIGNED 02/19/2012 12:14
